# Patient Record
Sex: Female | Born: 1970 | Race: White | Hispanic: No | Marital: Single | Smoking: Current every day smoker
Health system: Southern US, Community
[De-identification: ages and names within clinical notes are randomized; demographics above are authoritative.]

## PROBLEM LIST (undated history)

## (undated) DIAGNOSIS — F32A Depression, unspecified: Secondary | ICD-10-CM

## (undated) DIAGNOSIS — F419 Anxiety disorder, unspecified: Secondary | ICD-10-CM

## (undated) DIAGNOSIS — T7840XA Allergy, unspecified, initial encounter: Secondary | ICD-10-CM

## (undated) HISTORY — PX: CHOLECYSTECTOMY: SHX55

## (undated) HISTORY — PX: OTHER SURGICAL HISTORY: SHX169

## (undated) HISTORY — PX: CYSTECTOMY: SUR359

## (undated) HISTORY — DX: Allergy, unspecified, initial encounter: T78.40XA

## (undated) HISTORY — DX: Anxiety disorder, unspecified: F41.9

---

## 2000-04-25 ENCOUNTER — Other Ambulatory Visit: Admission: RE | Admit: 2000-04-25 | Discharge: 2000-04-25 | Payer: Self-pay | Admitting: Family Medicine

## 2001-04-26 ENCOUNTER — Other Ambulatory Visit: Admission: RE | Admit: 2001-04-26 | Discharge: 2001-04-26 | Payer: Self-pay | Admitting: Family Medicine

## 2002-05-07 ENCOUNTER — Other Ambulatory Visit: Admission: RE | Admit: 2002-05-07 | Discharge: 2002-05-07 | Payer: Self-pay | Admitting: Family Medicine

## 2003-05-22 ENCOUNTER — Other Ambulatory Visit: Admission: RE | Admit: 2003-05-22 | Discharge: 2003-05-22 | Payer: Self-pay | Admitting: Family Medicine

## 2004-05-25 ENCOUNTER — Other Ambulatory Visit: Admission: RE | Admit: 2004-05-25 | Discharge: 2004-05-25 | Payer: Self-pay | Admitting: Family Medicine

## 2004-11-17 ENCOUNTER — Encounter: Admission: RE | Admit: 2004-11-17 | Discharge: 2004-11-17 | Payer: Self-pay | Admitting: Family Medicine

## 2005-06-29 ENCOUNTER — Other Ambulatory Visit: Admission: RE | Admit: 2005-06-29 | Discharge: 2005-06-29 | Payer: Self-pay | Admitting: Family Medicine

## 2006-06-21 ENCOUNTER — Other Ambulatory Visit: Admission: RE | Admit: 2006-06-21 | Discharge: 2006-06-21 | Payer: Self-pay | Admitting: Family Medicine

## 2008-03-07 ENCOUNTER — Ambulatory Visit: Payer: Self-pay | Admitting: Gastroenterology

## 2008-03-25 ENCOUNTER — Ambulatory Visit (HOSPITAL_COMMUNITY): Admission: RE | Admit: 2008-03-25 | Discharge: 2008-03-25 | Payer: Self-pay | Admitting: Gastroenterology

## 2008-03-25 ENCOUNTER — Ambulatory Visit: Payer: Self-pay | Admitting: Gastroenterology

## 2008-03-25 ENCOUNTER — Encounter: Payer: Self-pay | Admitting: Gastroenterology

## 2008-09-17 ENCOUNTER — Ambulatory Visit: Payer: Self-pay | Admitting: Gastroenterology

## 2008-09-26 ENCOUNTER — Encounter: Payer: Self-pay | Admitting: Gastroenterology

## 2008-09-26 ENCOUNTER — Ambulatory Visit: Payer: Self-pay | Admitting: Internal Medicine

## 2008-09-26 ENCOUNTER — Ambulatory Visit (HOSPITAL_COMMUNITY): Admission: RE | Admit: 2008-09-26 | Discharge: 2008-09-26 | Payer: Self-pay | Admitting: Gastroenterology

## 2008-09-30 ENCOUNTER — Telehealth (INDEPENDENT_AMBULATORY_CARE_PROVIDER_SITE_OTHER): Payer: Self-pay

## 2008-11-07 DIAGNOSIS — R197 Diarrhea, unspecified: Secondary | ICD-10-CM | POA: Insufficient documentation

## 2008-11-07 DIAGNOSIS — R1013 Epigastric pain: Secondary | ICD-10-CM | POA: Insufficient documentation

## 2008-11-07 DIAGNOSIS — Z8711 Personal history of peptic ulcer disease: Secondary | ICD-10-CM | POA: Insufficient documentation

## 2008-11-07 DIAGNOSIS — R109 Unspecified abdominal pain: Secondary | ICD-10-CM | POA: Insufficient documentation

## 2008-11-07 DIAGNOSIS — R110 Nausea: Secondary | ICD-10-CM | POA: Insufficient documentation

## 2008-11-07 DIAGNOSIS — K625 Hemorrhage of anus and rectum: Secondary | ICD-10-CM | POA: Insufficient documentation

## 2009-03-17 ENCOUNTER — Telehealth (INDEPENDENT_AMBULATORY_CARE_PROVIDER_SITE_OTHER): Payer: Self-pay

## 2009-03-21 ENCOUNTER — Encounter: Payer: Self-pay | Admitting: Gastroenterology

## 2009-04-21 ENCOUNTER — Encounter: Payer: Self-pay | Admitting: Gastroenterology

## 2009-05-23 ENCOUNTER — Encounter: Payer: Self-pay | Admitting: Urgent Care

## 2009-05-26 ENCOUNTER — Encounter: Payer: Self-pay | Admitting: Gastroenterology

## 2009-05-26 ENCOUNTER — Encounter (INDEPENDENT_AMBULATORY_CARE_PROVIDER_SITE_OTHER): Payer: Self-pay | Admitting: *Deleted

## 2010-12-22 NOTE — Consult Note (Signed)
Tammie [name], [name]               ACCOUNT NO.:  [id number]   MEDICAL RECORD NO.:  [id number]          PATIENT TYPE:  AMB   LOCATION:  DAY                           FACILITY:  [facility]   PHYSICIAN:  Kassie Mends, M.D.      DATE OF BIRTH:  10/21/1970   DATE OF CONSULTATION:  03/07/2008  DATE OF DISCHARGE:                                 CONSULTATION   REFERRING PHYSICIAN:  Kirstie Peri, MD   REASON FOR CONSULTATION:  Diarrhea and abdominal pain.   HISTORY OF PRESENT ILLNESS:  Tammie [name] is a 40 year old female who  presented in April 2009 with pain and nausea in her upper abdomen after  eating.  She had laparoscopic cholecystectomy because ultrasound showed  gallstones.  She is now no longer having problem with the diarrhea, the  pain, or the nausea.  Her bowel movements vary.  That could be normal to  watery.  She had had watery stool once or twice in the last month.  She  did note 3-4 weeks ago a quarter size amount of blood on her tissue when  she wiped after straining to have a bowel movement.  She does not take  any over-the-counter supplements.  She eats cheese once a day.  She does  not consume milk.  She does consume ice cream regularly.  She denies any  black stool or problem swallowing.  She occasionally have heartburn and  indigestion, which she treats with Zantac.  Her symptoms are usually  triggered by a [place] or a Spaghetti.   PAST MEDICAL HISTORY:  Positive H. pylori serology in April 2009,  treated with Pylera .   PAST SURGICAL HISTORY:  Cyst removed from her tail bone.   ALLERGIES:  ASPIRIN causes hives.   MEDICATIONS:  1. Depo-Provera.  2. Benadryl nightly for congestion.  3. Vitamin C.  4. Vitamin E.  5. Calcium with vitamin D.  6. Multivitamin.  7. Ibuprofen as needed.   FAMILY HISTORY:  She has no family history of celiac sprue, colon  cancer, or colon polyps.  She had 2 uncles with bone cancer and skin  cancer.  Her grandfather had lung and esophageal  cancer.   SOCIAL HISTORY:  She is not married.  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  She smokes half a pack a day.  She does not drink any alcohol.   PHYSICAL EXAMINATION:  VITAL SIGNS:  Weight 232 pounds, height 5 feet 3  inches, temperature 98.2, blood pressure 112/86, and pulse 68.  GENERAL:  She is in no apparent distress, alert, and oriented x4.HEENT:  Atraumatic and normocephalic.  Pupils equal and reactive to light.  Mouth, no oral lesions.  Posterior pharynx without erythema or  exudate.LUNGS:  Clear to auscultation bilaterally.CARDIOVASCULAR:  Regular rate and rhythm.  No murmur.  Normal S1 and S2. ABDOMEN:  Bowel  sounds are present.  Soft, nontender, and nondistended.  No rebound or  guarding. EXTREMITIES:  No cyanosis or edema. NEUROLOGIC:  No focal  neurologic deficits.   ASSESSMENT:  Tammie [name] is a 40 year old female  who had postprandial  abdominal pain and diarrhea, which improved with a laparoscopic  cholecystectomy.  Her rectal bleeding may be due to internal  hemorrhoids, but the differential diagnoses includes colorectal polyp,  arteriovenous malformation, or colorectal malignancy. Thank you for  allowing me to see Tammie [name] in consultation. My recommendations  follow.   RECOMMENDATIONS:  1. Will schedule for a colonoscopy with a MoviPrep to evaluate her      rectal bleeding.  2. She may follow up with me as needed.      Kassie Mends, M.D.  Electronically Signed     SM/MEDQ  D:  03/07/2008  T:  03/08/2008  Job:  [id number]   cc:   Kirstie Peri, MD  Fax: [phone]

## 2010-12-22 NOTE — Op Note (Signed)
NAMEJANEA, [name]               ACCOUNT NO.:  [id number]   MEDICAL RECORD NO.:  [id number]          PATIENT TYPE:  AMB   LOCATION:  DAY                           FACILITY:  [facility]   PHYSICIAN:  Kassie Mends, M.D.      DATE OF BIRTH:  10-26-1970   DATE OF PROCEDURE:  03/25/2008  DATE OF DISCHARGE:                               OPERATIVE REPORT   REFERRING PHYSICIAN:  Dr. Mylinda Latina   PROCEDURE:  Ileocolonoscopy with cold forceps biopsy.   INDICATION FOR EXAM:  Tammie [name] is a 40 year old female who presents  with postprandial diarrhea and intermittent rectal bleeding.  She has a  significant past medical history of laparoscopic cholecystectomy.   FINDINGS:  1. Normal terminal ileum, approximately 20 cm visualized.  2. Normal colon without evidence of polyps, masses, inflammatory      changes, diverticular AVMs.  3. Small internal hemorrhoids.  Otherwise, normal retroflexed view of      the rectum.   DIAGNOSES:  1. Source of intermittent rectal bleeding are likely internal      hemorrhoids.  2. No source for diarrhea identified.  Random biopsies obtained via      cold forceps to evaluate for microscopic colitis.   RECOMMENDATIONS:  1. Lactose-free high-fiber diet.  She is given a handout on high-fiber      diet and lactose-free diet and hemorrhoids.  2. No aspirin, NSAIDs, or anticoagulation for 5 days.  3. Screening colonoscopy in 13 years.  4. Will call Tammie [name] with results of her biopsies.  5. She has a followup appointment in 6 weeks with Dr. Cira Servant for      diarrhea.   MEDICATIONS:  1. Demerol 125 mg IV.  2. Versed 6 mg IV.   PROCEDURE TECHNIQUE:  Physical exam was performed.  Informed consent was  obtained from the patient.  I have explained the benefits, risks, and  alternatives to the procedure.  The patient was connected to monitor and  placed in the left lateral position.  Continuous oxygen was provided by  nasal cannula.  IV medicine administered through an  indwelling cannula.  After administration of sedation and rectal exam, the patient's rectum  was intubated.  The scope was advanced under direct visualization to the  distal terminal ileum.  The scope was removed slowly by  carefully examining the color, texture, anatomy, and integrity mucosa on  the way out.  The patient was recovered in endoscopy and discharged home  in satisfactory condition.   PATH:  NL colon      Kassie Mends, M.D.  Electronically Signed     SM/MEDQ  D:  03/25/2008  T:  03/25/2008  Job:  [id number]   cc:   Kirstie Peri, MD  Fax: [phone]

## 2010-12-22 NOTE — Op Note (Signed)
Tammie [name], Tammie [name]               ACCOUNT NO.:  [id number]   MEDICAL RECORD NO.:  [id number]          PATIENT TYPE:  AMB   LOCATION:  DAY                           FACILITY:  [facility]   PHYSICIAN:  Kassie Mends, M.D.      DATE OF BIRTH:  12/16/1970   DATE OF PROCEDURE:  09/26/2008  DATE OF DISCHARGE:                                PROCEDURE NOTE   REFERRING PHYSICIAN:  Kirstie Peri, MD   PROCEDURE:  Esophagogastroduodenoscopy with cold forceps biopsy of the  gastric mucosa.   INDICATION FOR EXAM:  Tammie [name] is a 40 year old female with  postprandial nausea and abdominal pain.  She is taking multiple doses of  ibuprofen.  She may take up to 6 a day.  She is on omeprazole.  She has  history of positive H. pylori serology, which was treated.   FINDINGS:  1. Normal esophagus without evidence of Barrett, mass, erosion,      ulceration, or stricture.  2. Patchy erythema in the antrum and the body of the stomach without      erosion or ulceration.  Biopsies obtained via cold forceps to      evaluate for H. pylori gastritis.  3. Normal duodenal bulb and second portion of the duodenum.   DIAGNOSES:  1. Mild gastritis.  2. Postprandial abdominal pain and nausea associated with an      alternating stool consistency that is most likely secondary to      irritable bowel syndrome.   RECOMMENDATIONS:  1. Will call with biopsies.  2. No aspirin or anti-inflammatory drugs for 14 days.  3. She has a schedule followup in 2 months with Tana Coast regarding      her abdominal pain.  4. Add Bentyl 10 mg p.o. 30 minutes prior to breakfast and supper.  5. She should continue a high-fiber lactose-free diet.  She was given      a handout on gastritis.   MEDICATIONS:  1. Demerol 100 mg IV.  2. Versed 6 mg IV.   PROCEDURE TECHNIQUE:  Physical exam was performed.  Informed consent was  obtained from the patient after explaining the benefits, risks, and  alternatives to the procedure.  The patient  was connected to the monitor  and placed in the left lateral position.  Continuous oxygen was provided  by nasal cannula and IV medicine administered through an indwelling  cannula.  After administration of sedation, the patient's esophagus was  intubated and the scope was advanced under direct visualization to the  second portion of the duodenum.  The scope was removed  slowly by carefully examining the color, texture, anatomy, and integrity  of the mucosa on the way out.  The patient was recovered in endoscopy  and discharged home in satisfactory condition.   PATH:  Reactrive gastropathy      Kassie Mends, M.D.  Electronically Signed     SM/MEDQ  D:  09/26/2008  T:  09/26/2008  Job:  [id number]   cc:   Kirstie Peri, MD  Fax: [phone]

## 2010-12-22 NOTE — H&P (Signed)
Tammie [name], Tammie [name]               ACCOUNT NO.:  [id number]   MEDICAL RECORD NO.:  [id number]          PATIENT TYPE:  AMB   LOCATION:  DAY                           FACILITY:  [facility]   PHYSICIAN:  Kassie Mends, M.D.      DATE OF BIRTH:  22-Apr-1971   DATE OF ADMISSION:  DATE OF DISCHARGE:  LH                              HISTORY & PHYSICAL   CHIEF COMPLAINT:  Abdominal pain, nausea.   HISTORY OF PRESENT ILLNESS:  The patient is a 40 year old lady who  presents today with a couple of months' history of epigastric abdominal  pain associated with nausea.  She saw Dr. Cira Servant back in July 2009, for  her complaints of diarrhea and abdominal pain.  She had her gallbladder  removed in April 2009, and at that time she was seen here few months  later.  Her upper abdominal pain postprandially had pretty much  resolved.  She underwent a colonoscopy and was found to have small  internal hemorrhoids and normal terminal ileum up to 20 cm.  So, she was  asked to consume a lactose-free high-fiber diet.  She had random  biopsies taken to rule out microscopic colitis and they were benign.   The patient states that over the past couple of months she has had  recurrent postprandial epigastric pain  like what she had prior to  getting her gallbladder removed.  She recalls having one large gallstone  at the time of her surgery.  She admittedly has abdominal pain without  meals as well.  She describes the pain as knife-like and begins in the  epigastrium and goes down into the umbilicus.  Pain was severe yesterday  and she ended up going to the emergency department.  She had labs with  normal lipase and LFTs.  BUN and creatinine were normal.  CBC normal.  UA suggestive of a UTI and she was started on Ceftin.  She saw Dr. Clelia Croft  about a month ago and was put on Prevacid.  Yesterday, she was switched  to omeprazole by the emergency department doctor.  She denies any  dysphagia or odynophagia.  She denies any  heartburn.  Her bowel  movements go from loose to more firm alternating.  She rarely sees any  rectal bleeding anymore.  She admittedly takes ibuprofen about 8 a day.   CURRENT MEDICATIONS:  1. Depo-Provera once every 3 months.  2. Benadryl at bedtime.  3. Vitamin C daily.  4. Vitamin E daily.  5. Calcium with vitamin D daily.  6. Multivitamin daily.  7. Ibuprofen 8 daily.  8. Ceftin 250 mg b.i.d.  9. Omeprazole once daily, but she is not sure of the strength.   ALLERGIES:  ASPIRIN causes hives and CODEINE causes nausea.   PAST MEDICAL HISTORY:  History of positive H. pylori serology April  2009, treated with Pylera, colonoscopy as outlined above.   PAST SURGICAL HISTORY:  Status post cholecystectomy April 2009.  She had  a cyst removed from her tail bone.   FAMILY HISTORY:  Her father had liver transplant after gastric bypass  years ago, paternal grandmother had bleeding ulcers due to aspirin, 2  uncles deceased due to bone cancer and melanoma, and grandfather had  lung and esophageal cancer.  No family history of colorectal cancer or  colon polyps to her knowledge.   SOCIAL HISTORY:  She is single.  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  She smokes half a pack of  cigarettes daily, does not consume alcohol.   REVIEW OF SYSTEMS:  See HPI for GI.  She has had a 20-pound weight loss,  is currently on Weight Watchers.  Cardiopulmonary:  No chest pain,  shortness of breath, palpitations, or cough.  Genitourinary:  No dysuria  or hematuria.   PHYSICAL EXAMINATION:  VITAL SIGNS:  Weight 213, height 5 feet 4 inches,  temp 98.5, blood pressure 120/94, and pulse 74.  GENERAL:  A pleasant well-nourished, well-developed obese Caucasian  female in no acute distress.  SKIN:  Warm and dry.  No jaundice.  HEENT:  Sclerae nonicteric.  Oropharyngeal mucosa moist and pink.  No  lesions, erythema, or exudate.  No lymphadenopathy of thyromegaly.  CHEST:  Lungs  are clear to auscultation.  CARDIAC:  Regular rate and rhythm.  Normal S1 and S2.  No murmurs, rubs,  or gallops.  ABDOMEN:  Positive bowel sounds.  Abdomen is soft and obese.  She has  moderate tenderness in the epigastrium to deep palpation.  No rebound or  guarding.  No organomegaly or masses.  No abdominal bruits or hernias.  LOWER EXTREMITIES:  No edema.   IMPRESSION:  Tammie [name] is a 40 year old lady with a couple of months'  history of recurrent epigastric pain.  Symptoms are postprandially, but  also without meals.  She has nausea, but no vomiting.  Denies any reflux  symptoms.  She takes a significant amount of ibuprofen daily, would be  concerned about possibility of peptic ulcer disease or gastritis.  History of Helicobacter pylori status post treatment in April 2009.   PLAN:  1. EGD with Dr. Cira Servant in the near future.  2. We would like for her to be taking omeprazole at least 40 mg daily      30 minutes before breakfast.  She will check her dose at home and      adjust appropriately.  3. I advised no ibuprofen use until procedure and she does need to      consider limiting her use at any rate.      Tana Coast, P.A.      Kassie Mends, M.D.  Electronically Signed   LL/MEDQ  D:  09/17/2008  T:  09/18/2008  Job:  [id number]   cc:   Dr. Clelia Croft

## 2015-05-01 ENCOUNTER — Ambulatory Visit (INDEPENDENT_AMBULATORY_CARE_PROVIDER_SITE_OTHER): Payer: 59 | Admitting: Otolaryngology

## 2015-05-01 DIAGNOSIS — K219 Gastro-esophageal reflux disease without esophagitis: Secondary | ICD-10-CM | POA: Diagnosis not present

## 2015-05-01 DIAGNOSIS — R070 Pain in throat: Secondary | ICD-10-CM

## 2015-07-17 ENCOUNTER — Ambulatory Visit (INDEPENDENT_AMBULATORY_CARE_PROVIDER_SITE_OTHER): Payer: 59 | Admitting: Otolaryngology

## 2015-07-17 DIAGNOSIS — K219 Gastro-esophageal reflux disease without esophagitis: Secondary | ICD-10-CM

## 2015-07-17 DIAGNOSIS — R490 Dysphonia: Secondary | ICD-10-CM | POA: Diagnosis not present

## 2016-05-17 DIAGNOSIS — Z713 Dietary counseling and surveillance: Secondary | ICD-10-CM | POA: Diagnosis not present

## 2016-05-17 DIAGNOSIS — Z299 Encounter for prophylactic measures, unspecified: Secondary | ICD-10-CM | POA: Diagnosis not present

## 2016-05-17 DIAGNOSIS — Z6841 Body Mass Index (BMI) 40.0 and over, adult: Secondary | ICD-10-CM | POA: Diagnosis not present

## 2016-05-17 DIAGNOSIS — M549 Dorsalgia, unspecified: Secondary | ICD-10-CM | POA: Diagnosis not present

## 2016-05-20 DIAGNOSIS — Z1231 Encounter for screening mammogram for malignant neoplasm of breast: Secondary | ICD-10-CM | POA: Diagnosis not present

## 2016-05-26 DIAGNOSIS — Z299 Encounter for prophylactic measures, unspecified: Secondary | ICD-10-CM | POA: Diagnosis not present

## 2016-05-26 DIAGNOSIS — Z713 Dietary counseling and surveillance: Secondary | ICD-10-CM | POA: Diagnosis not present

## 2016-05-26 DIAGNOSIS — Z6841 Body Mass Index (BMI) 40.0 and over, adult: Secondary | ICD-10-CM | POA: Diagnosis not present

## 2016-05-26 DIAGNOSIS — M549 Dorsalgia, unspecified: Secondary | ICD-10-CM | POA: Diagnosis not present

## 2016-06-02 DIAGNOSIS — Z299 Encounter for prophylactic measures, unspecified: Secondary | ICD-10-CM | POA: Diagnosis not present

## 2016-06-02 DIAGNOSIS — M549 Dorsalgia, unspecified: Secondary | ICD-10-CM | POA: Diagnosis not present

## 2016-06-02 DIAGNOSIS — M5432 Sciatica, left side: Secondary | ICD-10-CM | POA: Diagnosis not present

## 2016-06-02 DIAGNOSIS — Z6841 Body Mass Index (BMI) 40.0 and over, adult: Secondary | ICD-10-CM | POA: Diagnosis not present

## 2016-06-07 DIAGNOSIS — M545 Low back pain: Secondary | ICD-10-CM | POA: Diagnosis not present

## 2016-06-11 DIAGNOSIS — M549 Dorsalgia, unspecified: Secondary | ICD-10-CM | POA: Diagnosis not present

## 2016-06-11 DIAGNOSIS — Z299 Encounter for prophylactic measures, unspecified: Secondary | ICD-10-CM | POA: Diagnosis not present

## 2016-07-19 DIAGNOSIS — M1710 Unilateral primary osteoarthritis, unspecified knee: Secondary | ICD-10-CM | POA: Diagnosis not present

## 2016-07-19 DIAGNOSIS — Z299 Encounter for prophylactic measures, unspecified: Secondary | ICD-10-CM | POA: Diagnosis not present

## 2016-07-19 DIAGNOSIS — Z6841 Body Mass Index (BMI) 40.0 and over, adult: Secondary | ICD-10-CM | POA: Diagnosis not present

## 2016-09-13 DIAGNOSIS — Z299 Encounter for prophylactic measures, unspecified: Secondary | ICD-10-CM | POA: Diagnosis not present

## 2016-09-13 DIAGNOSIS — F17210 Nicotine dependence, cigarettes, uncomplicated: Secondary | ICD-10-CM | POA: Diagnosis not present

## 2016-09-13 DIAGNOSIS — K219 Gastro-esophageal reflux disease without esophagitis: Secondary | ICD-10-CM | POA: Diagnosis not present

## 2016-09-13 DIAGNOSIS — I10 Essential (primary) hypertension: Secondary | ICD-10-CM | POA: Diagnosis not present

## 2016-09-13 DIAGNOSIS — Z6841 Body Mass Index (BMI) 40.0 and over, adult: Secondary | ICD-10-CM | POA: Diagnosis not present

## 2016-09-13 DIAGNOSIS — Z713 Dietary counseling and surveillance: Secondary | ICD-10-CM | POA: Diagnosis not present

## 2016-09-13 DIAGNOSIS — M549 Dorsalgia, unspecified: Secondary | ICD-10-CM | POA: Diagnosis not present

## 2016-10-07 DIAGNOSIS — F17210 Nicotine dependence, cigarettes, uncomplicated: Secondary | ICD-10-CM | POA: Diagnosis not present

## 2016-10-07 DIAGNOSIS — I10 Essential (primary) hypertension: Secondary | ICD-10-CM | POA: Diagnosis not present

## 2016-10-07 DIAGNOSIS — M25562 Pain in left knee: Secondary | ICD-10-CM | POA: Diagnosis not present

## 2016-10-07 DIAGNOSIS — Z6841 Body Mass Index (BMI) 40.0 and over, adult: Secondary | ICD-10-CM | POA: Diagnosis not present

## 2016-10-07 DIAGNOSIS — Z713 Dietary counseling and surveillance: Secondary | ICD-10-CM | POA: Diagnosis not present

## 2016-10-07 DIAGNOSIS — Z299 Encounter for prophylactic measures, unspecified: Secondary | ICD-10-CM | POA: Diagnosis not present

## 2016-11-12 DIAGNOSIS — J069 Acute upper respiratory infection, unspecified: Secondary | ICD-10-CM | POA: Diagnosis not present

## 2016-11-12 DIAGNOSIS — Z299 Encounter for prophylactic measures, unspecified: Secondary | ICD-10-CM | POA: Diagnosis not present

## 2016-11-12 DIAGNOSIS — K219 Gastro-esophageal reflux disease without esophagitis: Secondary | ICD-10-CM | POA: Diagnosis not present

## 2016-11-12 DIAGNOSIS — Z6841 Body Mass Index (BMI) 40.0 and over, adult: Secondary | ICD-10-CM | POA: Diagnosis not present

## 2016-11-12 DIAGNOSIS — I10 Essential (primary) hypertension: Secondary | ICD-10-CM | POA: Diagnosis not present

## 2016-12-31 DIAGNOSIS — Z6841 Body Mass Index (BMI) 40.0 and over, adult: Secondary | ICD-10-CM | POA: Diagnosis not present

## 2016-12-31 DIAGNOSIS — J069 Acute upper respiratory infection, unspecified: Secondary | ICD-10-CM | POA: Diagnosis not present

## 2016-12-31 DIAGNOSIS — Z713 Dietary counseling and surveillance: Secondary | ICD-10-CM | POA: Diagnosis not present

## 2016-12-31 DIAGNOSIS — Z299 Encounter for prophylactic measures, unspecified: Secondary | ICD-10-CM | POA: Diagnosis not present

## 2017-01-13 DIAGNOSIS — I499 Cardiac arrhythmia, unspecified: Secondary | ICD-10-CM | POA: Diagnosis not present

## 2017-01-13 DIAGNOSIS — Z1389 Encounter for screening for other disorder: Secondary | ICD-10-CM | POA: Diagnosis not present

## 2017-01-13 DIAGNOSIS — K219 Gastro-esophageal reflux disease without esophagitis: Secondary | ICD-10-CM | POA: Diagnosis not present

## 2017-01-13 DIAGNOSIS — Z299 Encounter for prophylactic measures, unspecified: Secondary | ICD-10-CM | POA: Diagnosis not present

## 2017-01-13 DIAGNOSIS — F17210 Nicotine dependence, cigarettes, uncomplicated: Secondary | ICD-10-CM | POA: Diagnosis not present

## 2017-01-13 DIAGNOSIS — Z79899 Other long term (current) drug therapy: Secondary | ICD-10-CM | POA: Diagnosis not present

## 2017-01-13 DIAGNOSIS — Z6841 Body Mass Index (BMI) 40.0 and over, adult: Secondary | ICD-10-CM | POA: Diagnosis not present

## 2017-01-13 DIAGNOSIS — G5600 Carpal tunnel syndrome, unspecified upper limb: Secondary | ICD-10-CM | POA: Diagnosis not present

## 2017-01-13 DIAGNOSIS — M1710 Unilateral primary osteoarthritis, unspecified knee: Secondary | ICD-10-CM | POA: Diagnosis not present

## 2017-01-13 DIAGNOSIS — Z01419 Encounter for gynecological examination (general) (routine) without abnormal findings: Secondary | ICD-10-CM | POA: Diagnosis not present

## 2017-01-13 DIAGNOSIS — I10 Essential (primary) hypertension: Secondary | ICD-10-CM | POA: Diagnosis not present

## 2017-01-13 DIAGNOSIS — F419 Anxiety disorder, unspecified: Secondary | ICD-10-CM | POA: Diagnosis not present

## 2017-01-13 DIAGNOSIS — Z0000 Encounter for general adult medical examination without abnormal findings: Secondary | ICD-10-CM | POA: Diagnosis not present

## 2017-01-19 DIAGNOSIS — Z79899 Other long term (current) drug therapy: Secondary | ICD-10-CM | POA: Diagnosis not present

## 2017-01-19 DIAGNOSIS — I10 Essential (primary) hypertension: Secondary | ICD-10-CM | POA: Diagnosis not present

## 2017-01-19 DIAGNOSIS — Z0000 Encounter for general adult medical examination without abnormal findings: Secondary | ICD-10-CM | POA: Diagnosis not present

## 2017-01-19 DIAGNOSIS — F419 Anxiety disorder, unspecified: Secondary | ICD-10-CM | POA: Diagnosis not present

## 2017-03-17 DIAGNOSIS — Z79899 Other long term (current) drug therapy: Secondary | ICD-10-CM | POA: Diagnosis not present

## 2017-03-17 DIAGNOSIS — M545 Low back pain: Secondary | ICD-10-CM | POA: Diagnosis not present

## 2017-03-17 DIAGNOSIS — M533 Sacrococcygeal disorders, not elsewhere classified: Secondary | ICD-10-CM | POA: Diagnosis not present

## 2017-03-17 DIAGNOSIS — M461 Sacroiliitis, not elsewhere classified: Secondary | ICD-10-CM | POA: Diagnosis not present

## 2017-03-17 DIAGNOSIS — M79604 Pain in right leg: Secondary | ICD-10-CM | POA: Diagnosis not present

## 2017-04-01 DIAGNOSIS — J069 Acute upper respiratory infection, unspecified: Secondary | ICD-10-CM | POA: Diagnosis not present

## 2017-04-01 DIAGNOSIS — Z299 Encounter for prophylactic measures, unspecified: Secondary | ICD-10-CM | POA: Diagnosis not present

## 2017-04-01 DIAGNOSIS — I82611 Acute embolism and thrombosis of superficial veins of right upper extremity: Secondary | ICD-10-CM | POA: Diagnosis not present

## 2017-04-01 DIAGNOSIS — K219 Gastro-esophageal reflux disease without esophagitis: Secondary | ICD-10-CM | POA: Diagnosis not present

## 2017-04-01 DIAGNOSIS — I10 Essential (primary) hypertension: Secondary | ICD-10-CM | POA: Diagnosis not present

## 2017-04-01 DIAGNOSIS — N959 Unspecified menopausal and perimenopausal disorder: Secondary | ICD-10-CM | POA: Diagnosis not present

## 2017-04-01 DIAGNOSIS — Z6841 Body Mass Index (BMI) 40.0 and over, adult: Secondary | ICD-10-CM | POA: Diagnosis not present

## 2017-04-01 DIAGNOSIS — F17210 Nicotine dependence, cigarettes, uncomplicated: Secondary | ICD-10-CM | POA: Diagnosis not present

## 2017-07-01 DIAGNOSIS — Z6841 Body Mass Index (BMI) 40.0 and over, adult: Secondary | ICD-10-CM | POA: Diagnosis not present

## 2017-07-01 DIAGNOSIS — Z299 Encounter for prophylactic measures, unspecified: Secondary | ICD-10-CM | POA: Diagnosis not present

## 2017-07-01 DIAGNOSIS — I10 Essential (primary) hypertension: Secondary | ICD-10-CM | POA: Diagnosis not present

## 2017-07-01 DIAGNOSIS — Z713 Dietary counseling and surveillance: Secondary | ICD-10-CM | POA: Diagnosis not present

## 2017-07-01 DIAGNOSIS — M7711 Lateral epicondylitis, right elbow: Secondary | ICD-10-CM | POA: Diagnosis not present

## 2017-07-01 DIAGNOSIS — R350 Frequency of micturition: Secondary | ICD-10-CM | POA: Diagnosis not present

## 2017-07-01 DIAGNOSIS — I82611 Acute embolism and thrombosis of superficial veins of right upper extremity: Secondary | ICD-10-CM | POA: Diagnosis not present

## 2017-07-01 DIAGNOSIS — K219 Gastro-esophageal reflux disease without esophagitis: Secondary | ICD-10-CM | POA: Diagnosis not present

## 2017-07-01 DIAGNOSIS — N390 Urinary tract infection, site not specified: Secondary | ICD-10-CM | POA: Diagnosis not present

## 2017-09-07 DIAGNOSIS — M810 Age-related osteoporosis without current pathological fracture: Secondary | ICD-10-CM | POA: Diagnosis not present

## 2017-09-07 DIAGNOSIS — L509 Urticaria, unspecified: Secondary | ICD-10-CM | POA: Diagnosis not present

## 2017-09-07 DIAGNOSIS — F17200 Nicotine dependence, unspecified, uncomplicated: Secondary | ICD-10-CM | POA: Diagnosis not present

## 2017-09-07 DIAGNOSIS — K219 Gastro-esophageal reflux disease without esophagitis: Secondary | ICD-10-CM | POA: Diagnosis not present

## 2017-09-07 DIAGNOSIS — M1990 Unspecified osteoarthritis, unspecified site: Secondary | ICD-10-CM | POA: Diagnosis not present

## 2017-09-07 DIAGNOSIS — Z79899 Other long term (current) drug therapy: Secondary | ICD-10-CM | POA: Diagnosis not present

## 2017-09-28 DIAGNOSIS — Z6838 Body mass index (BMI) 38.0-38.9, adult: Secondary | ICD-10-CM | POA: Diagnosis not present

## 2017-09-28 DIAGNOSIS — L509 Urticaria, unspecified: Secondary | ICD-10-CM | POA: Diagnosis not present

## 2017-10-06 ENCOUNTER — Ambulatory Visit (INDEPENDENT_AMBULATORY_CARE_PROVIDER_SITE_OTHER): Payer: BLUE CROSS/BLUE SHIELD | Admitting: Allergy & Immunology

## 2017-10-06 ENCOUNTER — Encounter: Payer: Self-pay | Admitting: Allergy & Immunology

## 2017-10-06 VITALS — BP 128/80 | HR 93 | Temp 98.3°F | Resp 18 | Ht 62.5 in | Wt 221.6 lb

## 2017-10-06 DIAGNOSIS — L508 Other urticaria: Secondary | ICD-10-CM | POA: Diagnosis not present

## 2017-10-06 DIAGNOSIS — J302 Other seasonal allergic rhinitis: Secondary | ICD-10-CM

## 2017-10-06 MED ORDER — FEXOFENADINE HCL 180 MG PO TABS
360.0000 mg | ORAL_TABLET | Freq: Every day | ORAL | 5 refills | Status: DC
Start: 2017-10-06 — End: 2018-09-29

## 2017-10-06 MED ORDER — EPINEPHRINE 0.3 MG/0.3ML IJ SOAJ: 0.3000 mg | Freq: Once | INTRAMUSCULAR | 1 refills | Status: AC

## 2017-10-06 MED ORDER — CETIRIZINE HCL 10 MG PO TABS: 20.0000 mg | ORAL_TABLET | Freq: Every day | ORAL | 5 refills | Status: DC

## 2017-10-06 MED ORDER — RANITIDINE HCL 300 MG PO TABS: 300.0000 mg | ORAL_TABLET | Freq: Every day | ORAL | 5 refills | Status: DC

## 2017-10-06 NOTE — Progress Notes (Signed)
NEW PATIENT  Date of Service/Encounter:  10/06/17  Referring provider: Rosalee Kaufman, PA-C   Assessment:   Chronic idiopathic urticaria  Seasonal allergic rhinitis  Plan/Recommendations:   1. Chronic urticaria - Your history does not have any "red flags" such as fevers, joint pains, or permanent skin changes that would be concerning for a more serious cause of hives.  - We will get some labs to rule out serious causes of hives: complete blood count, tryptase level, chronic urticaria panel, CMP, ESR, and CRP. - We will also get an environmental allergy panel as well, given the history of allergic rhinitis symptoms.  - Chronic hives are often times a self limited process and will "burn themselves out" over 6-12 months, although this is not always the case.   - We will send in [place] (epinephrine); they should call you in 1-2 days to confirm your shipping address.  - In the meantime, start suppressive dosing of antihistamines:   - Morning: Allegra (fexofenadine) 350m (two tablets)  - Evening: Zyrtec (cetirizine) 216m(two tablets) + Singulair (montelukast) 1060m- If the above is not working, try adding: Zantac (ranitidine) 300m44mwo tablets) - You can change this dosing at home, decreasing the dose as needed or increasing the dosing as needed.  - If you are not tolerating the medications or are tired of taking them every day, we can start treatment with a monthly injectable medication called Xolair.   2. Return in about 3 months (around 01/03/2018).  Subjective:   Tammie [name] 46 y44. female presenting today for evaluation of  Chief Complaint  Patient presents with  . Urticaria    Tammie [name] has a history of the following: Patient Active Problem List   Diagnosis Date Noted  . RECTAL BLEEDING 11/07/2008  . NAUSEA 11/07/2008  . DIARRHEA 11/07/2008  . ABDOMINAL PAIN 11/07/2008  . EPIGASTRIC PAIN 11/07/2008  . HELICOBACTER PYLORI INFECTION, HX OF  11/07/2008    History obtained from: chart review and patient and the patient's friend who accompanies her today.  Tammie [name] was referred by SkilRosalee [name]-C.     [name] 46 y61. female presenting for evaluation of urticaria. She has a long-standing history of hives. She first had hives since she was 7 ye17rs old. Her mother had given her acetaminophen prior to this episode. Evidently, per the patient, she was diagnosed with an allergy to artificial colors and aspirin. Then they went away for a period of time. Years later - in high school - she developed hives to Excedrin.   Between high school and now, she has been fine. Then around four weeks ago her hives started up again. She was told to take three Benadryl daily. Then she went somewhere else and was told to take Claritin daily. During these episodes, she has mostly had no problems with systemic reactions. Then today she was at work dealing with her boss and developed shortness of breath and felt as if her throat was closing. She thinks that this could have been related to stress from her boss. This seemed to improve with some exposure to the outdoor air. She has been under quite a bit of stress lately. She is also stressed at home where she is taking care of her mother. She is also looking after her fiancee's parent as well. 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   She has no history  of asthma at all. Otherwise, there is no history of other atopic diseases, including drug allergies, food allergies, environmental allergies, or stinging insect allergies. There is no significant infectious history. Vaccinations are up to date.    Past Medical History: Patient Active Problem List   Diagnosis Date Noted  . RECTAL BLEEDING 11/07/2008  . NAUSEA 11/07/2008  . DIARRHEA 11/07/2008  . ABDOMINAL PAIN 11/07/2008  . EPIGASTRIC PAIN 11/07/2008  . HELICOBACTER PYLORI INFECTION, HX OF  11/07/2008    Medication List:  Allergies as of 10/06/2017      Reactions   Aspirin    Codeine       Medication List        Accurate as of 10/06/17  8:45 PM. Always use your most recent med list.          cetirizine 10 MG tablet Commonly known as:  ZYRTEC Take 2 tablets (20 mg total) by mouth daily.   EPINEPHrine 0.3 mg/0.3 mL Soaj injection Commonly known as:  AUVI-Q Inject 0.3 mLs (0.3 mg total) into the muscle once for 1 dose.   fexofenadine 180 MG tablet Commonly known as:  ALLEGRA Take 2 tablets (360 mg total) by mouth daily.   meloxicam 15 MG tablet Commonly known as:  MOBIC Take 15 mg by mouth daily.   omeprazole 20 MG capsule Commonly known as:  PRILOSEC Take 20 mg by mouth daily.   ranitidine 300 MG tablet Commonly known as:  ZANTAC Take 1 tablet (300 mg total) by mouth at bedtime.       Birth History: non-contributory.   Developmental History: non-contributory.   Past Surgical History: Past Surgical History:  Procedure Laterality Date  . CHOLECYSTECTOMY       Family History: History reviewed. No pertinent family history.   Social History: Tanicka lives at home with her family. They live in a 48yo home. There is wood flooring throughout the home. There is gas and electric heating as well as central cooling. There is one dog in the home. There are no dust mite covers on her bedding. She does smoke in the home since 1990 (1/2 pack per day).      Review of Systems: a 14-point review of systems is pertinent for what is mentioned in HPI.  Otherwise, all other systems were negative. Constitutional: negative other than that listed in the HPI Eyes: negative other than that listed in the HPI Ears, nose, mouth, throat, and face: negative other than that listed in the HPI Respiratory: negative other than that listed in the HPI Cardiovascular: negative other than that listed in the HPI Gastrointestinal: negative other than that listed in the  HPI Genitourinary: negative other than that listed in the HPI Integument: negative other than that listed in the HPI Hematologic: negative other than that listed in the HPI Musculoskeletal: negative other than that listed in the HPI Neurological: negative other than that listed in the HPI Allergy/Immunologic: negative other than that listed in the HPI    Objective:   Blood pressure 128/80, pulse 93, temperature 98.3 F (36.8 C), resp. rate 18, height 5' 2.5" (1.588 m), weight 221 lb 9.6 oz (100.5 kg), SpO2 97 %. Body mass index is 39.89 kg/m.   Physical Exam:  General: Alert, interactive, in no acute distress. Clearly.distressed.Smells strongly of cigarette smoke.  Eyes: No conjunctival injection bilaterally, no discharge on the right, no discharge on the left and no Horner-Trantas dots present. PERRL bilaterally. EOMI without pain. No photophobia.  Ears: Right TM pearly  gray with normal light reflex, Left TM pearly gray with normal light reflex, Right TM intact without perforation and Left TM intact without perforation.  Nose/Throat: External nose within normal limits and septum midline. Turbinates edematous and pale with clear discharge. Posterior oropharynx erythematous without cobblestoning in the posterior oropharynx. Tonsils 2+ without exudates.  Tongue without thrush. Neck: Supple without thyromegaly. Trachea midline. Adenopathy: no enlarged lymph nodes appreciated in the anterior cervical, occipital, axillary, epitrochlear, inguinal, or popliteal regions. Lungs: Clear to auscultation without wheezing, rhonchi or rales. No increased work of breathing. CV: Normal S1/S2. No murmurs. Capillary refill <2 seconds.  Abdomen: Nondistended, nontender. No guarding or rebound tenderness. Bowel sounds present in all fields and hypoactive  Skin: Dry, mildly hyperpigmented, mildly thickened patches on the abdomen, bilateral arms, bilateral legs, and waist. Extremities:  No clubbing, cyanosis  or edema. Neuro:   Grossly intact. No focal deficits appreciated. Responsive to questions.  Diagnostic studies: deferred due to recent antihistamine use    Salvatore Marvel, MD [facility]

## 2017-10-06 NOTE — Patient Instructions (Addendum)
1. Chronic urticaria - Your history does not have any "red flags" such as fevers, joint pains, or permanent skin changes that would be concerning for a more serious cause of hives.  - We will get some labs to rule out serious causes of hives: complete blood count, tryptase level, chronic urticaria panel, CMP, ESR, and CRP. - Chronic hives are often times a self limited process and will "burn themselves out" over 6-12 months, although this is not always the case.   - We will send in [place] (epinephrine); they should call you in 1-2 days to confirm your shipping address.  - In the meantime, start suppressive dosing of antihistamines:   - Morning: Allegra (fexofenadine) 372m (two tablets)  - Evening: Zyrtec (cetirizine) 260m(two tablets) + Singulair (montelukast) 1033m- If the above is not working, try adding: Zantac (ranitidine) 300m35mwo tablets) - You can change this dosing at home, decreasing the dose as needed or increasing the dosing as needed.  - If you are not tolerating the medications or are tired of taking them every day, we can start treatment with a monthly injectable medication called Xolair.   2. Return in about 3 months (around 01/03/2018).   Please inform us o[place]any Emergency Department visits, hospitalizations, or changes in symptoms. Call us b[place]ore going to the ED for breathing or allergy symptoms since we might be able to fit you in for a sick visit. Feel free to contact us a[place]atime with any questions, problems, or concerns.  It was a pleasure to meet you today!  Websites that have reliable patient information: 1. American Academy of Asthma, Allergy, and Immunology: www.aaaai.org 2. Food Allergy Research and Education (FARE): foodallergy.org 3. Mothers of Asthmatics: http://www.asthmacommunitynetwork.org 4. American College of Allergy, Asthma, and Immunology: www.acaai.org

## 2017-10-07 ENCOUNTER — Other Ambulatory Visit: Payer: Self-pay | Admitting: Allergy & Immunology

## 2017-10-07 MED ORDER — MONTELUKAST SODIUM 10 MG PO TABS: 10.0000 mg | ORAL_TABLET | Freq: Every day | ORAL | 5 refills | Status: DC

## 2017-10-07 NOTE — Telephone Encounter (Signed)
Called and informed patient that I have sent that Rx over to the pharmacy.

## 2017-10-07 NOTE — Progress Notes (Signed)
Lab requisition printed. I will give to Meagan with [facility] to have it added.

## 2017-10-07 NOTE — Telephone Encounter (Signed)
Patient saw Dr. Dellis AnesGallagher yesterday, 10-06-17, and was suppose to get the prescription for Singulair. She went to her pharmacy and it was not there. She was able to pick up Zyrtec and Allegra. [facility].

## 2017-10-08 LAB — CBC WITH DIFFERENTIAL/PLATELET
Basophils Absolute: 0 10*3/uL (ref 0.0–0.2)
Basos: 0 %
EOS (ABSOLUTE): 0.1 10*3/uL (ref 0.0–0.4)
EOS: 1 %
HEMATOCRIT: 45.8 % (ref 34.0–46.6)
HEMOGLOBIN: 15.7 g/dL (ref 11.1–15.9)
Immature Grans (Abs): 0 10*3/uL (ref 0.0–0.1)
Immature Granulocytes: 0 %
LYMPHS ABS: 1.7 10*3/uL (ref 0.7–3.1)
Lymphs: 18 %
MCH: 33.6 pg — AB (ref 26.6–33.0)
MCHC: 34.3 g/dL (ref 31.5–35.7)
MCV: 98 fL — ABNORMAL HIGH (ref 79–97)
MONOCYTES: 8 %
MONOS ABS: 0.7 10*3/uL (ref 0.1–0.9)
NEUTROS ABS: 6.9 10*3/uL (ref 1.4–7.0)
Neutrophils: 73 %
Platelets: 232 10*3/uL (ref 150–379)
RBC: 4.67 x10E6/uL (ref 3.77–5.28)
RDW: 13 % (ref 12.3–15.4)
WBC: 9.5 10*3/uL (ref 3.4–10.8)

## 2017-10-08 LAB — CMP14+EGFR
A/G RATIO: 1.6 (ref 1.2–2.2)
ALBUMIN: 4.6 g/dL (ref 3.5–5.5)
ALK PHOS: 58 IU/L (ref 39–117)
ALT: 26 IU/L (ref 0–32)
AST: 23 IU/L (ref 0–40)
BILIRUBIN TOTAL: 0.9 mg/dL (ref 0.0–1.2)
BUN / CREAT RATIO: 17 (ref 9–23)
BUN: 12 mg/dL (ref 6–24)
CHLORIDE: 101 mmol/L (ref 96–106)
CO2: 20 mmol/L (ref 20–29)
Calcium: 9.6 mg/dL (ref 8.7–10.2)
Creatinine, Ser: 0.71 mg/dL (ref 0.57–1.00)
GFR calc Af Amer: 117 mL/min/{1.73_m2} (ref >59)
GFR calc non Af Amer: 102 mL/min/{1.73_m2} (ref >59)
GLOBULIN, TOTAL: 2.8 g/dL (ref 1.5–4.5)
Glucose: 88 mg/dL (ref 65–99)
POTASSIUM: 4.2 mmol/L (ref 3.5–5.2)
SODIUM: 139 mmol/L (ref 134–144)
Total Protein: 7.4 g/dL (ref 6.0–8.5)

## 2017-10-08 LAB — C-REACTIVE PROTEIN: CRP: 4.8 mg/L (ref 0.0–4.9)

## 2017-10-08 LAB — THYROID ANTIBODIES
Thyroglobulin Antibody: 7.9 IU/mL — ABNORMAL HIGH (ref 0.0–0.9)
Thyroperoxidase Ab SerPl-aCnc: 342 IU/mL — ABNORMAL HIGH (ref 0–34)

## 2017-10-08 LAB — SEDIMENTATION RATE: Sed Rate: 25 mm/hr (ref 0–32)

## 2017-10-08 LAB — ANA: Anti Nuclear Antibody(ANA): NEGATIVE

## 2017-10-08 LAB — TRYPTASE: TRYPTASE: 2.9 ug/L (ref 2.2–13.2)

## 2017-10-11 ENCOUNTER — Telehealth: Payer: Self-pay | Admitting: Allergy & Immunology

## 2017-10-11 LAB — IGE+ALLERGENS ZONE 2(30)
Alternaria Alternata IgE: <0.1 kU/L
Amer Sycamore IgE Qn: <0.1 kU/L
Cedar, Mountain IgE: <0.1 kU/L
Cladosporium Herbarum IgE: <0.1 kU/L
Cockroach, American IgE: <0.1 kU/L
D Farinae IgE: <0.1 kU/L
D Pteronyssinus IgE: <0.1 kU/L
Dog Dander IgE: <0.1 kU/L
Hickory, White IgE: <0.1 kU/L
IgE (Immunoglobulin E), Serum: 27 IU/mL (ref 0–100)
Maple/Box Elder IgE: <0.1 kU/L
Mucor Racemosus IgE: <0.1 kU/L
Nettle IgE: <0.1 kU/L
Oak, White IgE: <0.1 kU/L
Penicillium Chrysogen IgE: <0.1 kU/L
Pigweed, Rough IgE: <0.1 kU/L
Plantain, English IgE: <0.1 kU/L
Ragweed, Short IgE: <0.1 kU/L
Sheep Sorrel IgE Qn: <0.1 kU/L
Sweet gum IgE RAST Ql: <0.1 kU/L

## 2017-10-11 LAB — SPECIMEN STATUS REPORT

## 2017-10-11 NOTE — Telephone Encounter (Signed)
Pt called and said that she is itching pretty bad and wants to know can she take Benadryl with the other meds you have her on. [facility]. [phone].

## 2017-10-11 NOTE — Telephone Encounter (Signed)
I spoke with Tammie [name] after reviewing the office note from 10-06-17. She has been taking 2 tabs of Allegra every morning and 2 tabs of Zyrtec every evening along with the montelukast 10 mg. She had not added on the ranitidine  300 mg qhs. She did not know if that would help or not. I advised her that in conjunction the other meds she is on it should help relieve the symptoms. I advised her to try that for 1 week and to call us back to let us know how she is doing.

## 2017-10-14 ENCOUNTER — Telehealth: Payer: Self-pay

## 2017-10-14 NOTE — Telephone Encounter (Signed)
I would recommend that she come in and start Xolair. We can add on a mid-day dose of cetirizine 20mg  as well.  Malachi BondsJoel Shalee Paolo, MD [facility]

## 2017-10-14 NOTE — Telephone Encounter (Signed)
Pt. Called stating that she continues to have itching. Not as bad as before but still bothersome. Is taking all meds as prescribed. Please advise.

## 2017-10-14 NOTE — Telephone Encounter (Signed)
Tried to contact patient no answer 

## 2017-10-14 NOTE — Telephone Encounter (Signed)
Patient called back and I went over instructions per Dr Dellis AnesGallagher and discussed Xolair. I will send patient info and Xolair and she is wanting to try medications for another week and she will contact me and let me know. At that time I will submit and maybe arrange sample to be given in [place] office

## 2017-10-14 NOTE — Telephone Encounter (Signed)
New start Xolair

## 2017-10-20 NOTE — Telephone Encounter (Signed)
Excellent - sounds like a good plan!   Malachi BondsJoel Lenee Franze, MD [facility]

## 2017-10-25 DIAGNOSIS — L299 Pruritus, unspecified: Secondary | ICD-10-CM | POA: Diagnosis not present

## 2017-10-25 DIAGNOSIS — Z1389 Encounter for screening for other disorder: Secondary | ICD-10-CM | POA: Diagnosis not present

## 2017-10-25 DIAGNOSIS — Z6838 Body mass index (BMI) 38.0-38.9, adult: Secondary | ICD-10-CM | POA: Diagnosis not present

## 2017-10-25 DIAGNOSIS — L509 Urticaria, unspecified: Secondary | ICD-10-CM | POA: Diagnosis not present

## 2017-10-26 DIAGNOSIS — M810 Age-related osteoporosis without current pathological fracture: Secondary | ICD-10-CM | POA: Diagnosis not present

## 2017-10-26 DIAGNOSIS — R062 Wheezing: Secondary | ICD-10-CM | POA: Diagnosis not present

## 2017-10-26 DIAGNOSIS — K219 Gastro-esophageal reflux disease without esophagitis: Secondary | ICD-10-CM | POA: Diagnosis not present

## 2017-10-26 DIAGNOSIS — R221 Localized swelling, mass and lump, neck: Secondary | ICD-10-CM | POA: Diagnosis not present

## 2017-10-26 DIAGNOSIS — Z79899 Other long term (current) drug therapy: Secondary | ICD-10-CM | POA: Diagnosis not present

## 2017-10-26 DIAGNOSIS — M1990 Unspecified osteoarthritis, unspecified site: Secondary | ICD-10-CM | POA: Diagnosis not present

## 2017-10-26 DIAGNOSIS — F17200 Nicotine dependence, unspecified, uncomplicated: Secondary | ICD-10-CM | POA: Diagnosis not present

## 2017-10-26 DIAGNOSIS — L509 Urticaria, unspecified: Secondary | ICD-10-CM | POA: Diagnosis not present

## 2017-10-27 DIAGNOSIS — Z886 Allergy status to analgesic agent status: Secondary | ICD-10-CM | POA: Diagnosis not present

## 2017-10-27 DIAGNOSIS — F410 Panic disorder [episodic paroxysmal anxiety] without agoraphobia: Secondary | ICD-10-CM | POA: Diagnosis not present

## 2017-10-27 DIAGNOSIS — Z9049 Acquired absence of other specified parts of digestive tract: Secondary | ICD-10-CM | POA: Diagnosis not present

## 2017-10-27 DIAGNOSIS — R05 Cough: Secondary | ICD-10-CM | POA: Diagnosis not present

## 2017-10-27 DIAGNOSIS — R0602 Shortness of breath: Secondary | ICD-10-CM | POA: Diagnosis not present

## 2017-10-27 DIAGNOSIS — M1990 Unspecified osteoarthritis, unspecified site: Secondary | ICD-10-CM | POA: Diagnosis not present

## 2017-10-27 DIAGNOSIS — Z6837 Body mass index (BMI) 37.0-37.9, adult: Secondary | ICD-10-CM | POA: Diagnosis not present

## 2017-10-27 DIAGNOSIS — L500 Allergic urticaria: Secondary | ICD-10-CM | POA: Diagnosis not present

## 2017-10-27 DIAGNOSIS — F17200 Nicotine dependence, unspecified, uncomplicated: Secondary | ICD-10-CM | POA: Diagnosis not present

## 2017-10-27 DIAGNOSIS — T7840XA Allergy, unspecified, initial encounter: Secondary | ICD-10-CM | POA: Diagnosis not present

## 2017-10-27 DIAGNOSIS — J449 Chronic obstructive pulmonary disease, unspecified: Secondary | ICD-10-CM | POA: Diagnosis not present

## 2017-10-27 DIAGNOSIS — Z79899 Other long term (current) drug therapy: Secondary | ICD-10-CM | POA: Diagnosis not present

## 2017-10-27 DIAGNOSIS — K219 Gastro-esophageal reflux disease without esophagitis: Secondary | ICD-10-CM | POA: Diagnosis not present

## 2017-10-27 DIAGNOSIS — K589 Irritable bowel syndrome without diarrhea: Secondary | ICD-10-CM | POA: Diagnosis not present

## 2017-10-27 DIAGNOSIS — R0789 Other chest pain: Secondary | ICD-10-CM | POA: Diagnosis not present

## 2017-10-27 DIAGNOSIS — R072 Precordial pain: Secondary | ICD-10-CM | POA: Diagnosis not present

## 2017-10-27 DIAGNOSIS — M810 Age-related osteoporosis without current pathological fracture: Secondary | ICD-10-CM | POA: Diagnosis not present

## 2017-10-28 DIAGNOSIS — R0602 Shortness of breath: Secondary | ICD-10-CM | POA: Diagnosis not present

## 2017-10-28 DIAGNOSIS — T7840XA Allergy, unspecified, initial encounter: Secondary | ICD-10-CM | POA: Diagnosis not present

## 2017-10-28 DIAGNOSIS — F410 Panic disorder [episodic paroxysmal anxiety] without agoraphobia: Secondary | ICD-10-CM | POA: Diagnosis not present

## 2017-10-28 DIAGNOSIS — L509 Urticaria, unspecified: Secondary | ICD-10-CM | POA: Diagnosis not present

## 2017-11-03 DIAGNOSIS — F419 Anxiety disorder, unspecified: Secondary | ICD-10-CM | POA: Diagnosis not present

## 2017-11-03 DIAGNOSIS — L509 Urticaria, unspecified: Secondary | ICD-10-CM | POA: Diagnosis not present

## 2017-11-03 DIAGNOSIS — Z6837 Body mass index (BMI) 37.0-37.9, adult: Secondary | ICD-10-CM | POA: Diagnosis not present

## 2017-11-03 DIAGNOSIS — L299 Pruritus, unspecified: Secondary | ICD-10-CM | POA: Diagnosis not present

## 2017-11-27 DIAGNOSIS — L509 Urticaria, unspecified: Secondary | ICD-10-CM | POA: Diagnosis not present

## 2017-11-27 DIAGNOSIS — L299 Pruritus, unspecified: Secondary | ICD-10-CM | POA: Diagnosis not present

## 2017-11-27 DIAGNOSIS — Z6837 Body mass index (BMI) 37.0-37.9, adult: Secondary | ICD-10-CM | POA: Diagnosis not present

## 2017-11-27 DIAGNOSIS — F419 Anxiety disorder, unspecified: Secondary | ICD-10-CM | POA: Diagnosis not present

## 2017-11-30 DIAGNOSIS — F419 Anxiety disorder, unspecified: Secondary | ICD-10-CM | POA: Diagnosis not present

## 2017-11-30 DIAGNOSIS — Z6838 Body mass index (BMI) 38.0-38.9, adult: Secondary | ICD-10-CM | POA: Diagnosis not present

## 2017-11-30 DIAGNOSIS — L509 Urticaria, unspecified: Secondary | ICD-10-CM | POA: Diagnosis not present

## 2017-11-30 DIAGNOSIS — J019 Acute sinusitis, unspecified: Secondary | ICD-10-CM | POA: Diagnosis not present

## 2017-12-29 DIAGNOSIS — Z1231 Encounter for screening mammogram for malignant neoplasm of breast: Secondary | ICD-10-CM | POA: Diagnosis not present

## 2018-03-21 DIAGNOSIS — L509 Urticaria, unspecified: Secondary | ICD-10-CM | POA: Diagnosis not present

## 2018-03-21 DIAGNOSIS — F419 Anxiety disorder, unspecified: Secondary | ICD-10-CM | POA: Diagnosis not present

## 2018-03-21 DIAGNOSIS — M7711 Lateral epicondylitis, right elbow: Secondary | ICD-10-CM | POA: Diagnosis not present

## 2018-03-21 DIAGNOSIS — M79672 Pain in left foot: Secondary | ICD-10-CM | POA: Diagnosis not present

## 2018-06-19 DIAGNOSIS — Z6841 Body Mass Index (BMI) 40.0 and over, adult: Secondary | ICD-10-CM | POA: Diagnosis not present

## 2018-06-19 DIAGNOSIS — L0390 Cellulitis, unspecified: Secondary | ICD-10-CM | POA: Diagnosis not present

## 2018-06-19 DIAGNOSIS — L0291 Cutaneous abscess, unspecified: Secondary | ICD-10-CM | POA: Diagnosis not present

## 2018-06-21 DIAGNOSIS — L02214 Cutaneous abscess of groin: Secondary | ICD-10-CM | POA: Diagnosis not present

## 2018-07-07 DIAGNOSIS — J019 Acute sinusitis, unspecified: Secondary | ICD-10-CM | POA: Diagnosis not present

## 2018-07-07 DIAGNOSIS — J209 Acute bronchitis, unspecified: Secondary | ICD-10-CM | POA: Diagnosis not present

## 2018-07-07 DIAGNOSIS — Z6841 Body Mass Index (BMI) 40.0 and over, adult: Secondary | ICD-10-CM | POA: Diagnosis not present

## 2018-08-21 ENCOUNTER — Other Ambulatory Visit: Payer: Self-pay | Admitting: Allergy & Immunology

## 2018-09-14 DIAGNOSIS — Z6841 Body Mass Index (BMI) 40.0 and over, adult: Secondary | ICD-10-CM | POA: Diagnosis not present

## 2018-09-14 DIAGNOSIS — N3000 Acute cystitis without hematuria: Secondary | ICD-10-CM | POA: Diagnosis not present

## 2018-09-29 ENCOUNTER — Emergency Department (HOSPITAL_COMMUNITY): Payer: BLUE CROSS/BLUE SHIELD

## 2018-09-29 ENCOUNTER — Emergency Department (HOSPITAL_COMMUNITY)
Admission: EM | Admit: 2018-09-29 | Discharge: 2018-09-30 | Disposition: A | Discharge status: Home / Self Care | Payer: BLUE CROSS/BLUE SHIELD | Attending: Emergency Medicine | Admitting: Emergency Medicine

## 2018-09-29 ENCOUNTER — Encounter (HOSPITAL_COMMUNITY): Payer: Self-pay

## 2018-09-29 ENCOUNTER — Other Ambulatory Visit: Payer: Self-pay

## 2018-09-29 DIAGNOSIS — R197 Diarrhea, unspecified: Secondary | ICD-10-CM

## 2018-09-29 DIAGNOSIS — F17200 Nicotine dependence, unspecified, uncomplicated: Secondary | ICD-10-CM | POA: Insufficient documentation

## 2018-09-29 DIAGNOSIS — R1010 Upper abdominal pain, unspecified: Secondary | ICD-10-CM | POA: Diagnosis not present

## 2018-09-29 DIAGNOSIS — R1013 Epigastric pain: Secondary | ICD-10-CM

## 2018-09-29 DIAGNOSIS — Z79899 Other long term (current) drug therapy: Secondary | ICD-10-CM | POA: Diagnosis not present

## 2018-09-29 LAB — COMPREHENSIVE METABOLIC PANEL
ALBUMIN: 4.1 g/dL (ref 3.5–5.0)
ALK PHOS: 52 U/L (ref 38–126)
ALT: 63 U/L — ABNORMAL HIGH (ref 0–44)
AST: 64 U/L — AB (ref 15–41)
Anion gap: 10 (ref 5–15)
BILIRUBIN TOTAL: 1.2 mg/dL (ref 0.3–1.2)
BUN: 16 mg/dL (ref 6–20)
CO2: 24 mmol/L (ref 22–32)
Calcium: 9.1 mg/dL (ref 8.9–10.3)
Chloride: 98 mmol/L (ref 98–111)
Creatinine, Ser: 0.72 mg/dL (ref 0.44–1.00)
GFR calc Af Amer: >60 mL/min (ref >60)
GLUCOSE: 128 mg/dL — AB (ref 70–99)
Potassium: 3.8 mmol/L (ref 3.5–5.1)
Sodium: 132 mmol/L — ABNORMAL LOW (ref 135–145)
TOTAL PROTEIN: 7.5 g/dL (ref 6.5–8.1)

## 2018-09-29 LAB — CBC
HCT: 49.1 % — ABNORMAL HIGH (ref 36.0–46.0)
Hemoglobin: 16.2 g/dL — ABNORMAL HIGH (ref 12.0–15.0)
MCH: 32.5 pg (ref 26.0–34.0)
MCHC: 33 g/dL (ref 30.0–36.0)
MCV: 98.4 fL (ref 80.0–100.0)
PLATELETS: 205 10*3/uL (ref 150–400)
RBC: 4.99 MIL/uL (ref 3.87–5.11)
RDW: 12.7 % (ref 11.5–15.5)
WBC: 11.4 10*3/uL — AB (ref 4.0–10.5)
nRBC: 0 % (ref 0.0–0.2)

## 2018-09-29 LAB — LIPASE, BLOOD: Lipase: 38 U/L (ref 11–51)

## 2018-09-29 MED ORDER — FENTANYL CITRATE (PF) 100 MCG/2ML IJ SOLN
50.0000 ug | Freq: Once | INTRAMUSCULAR | Status: AC
Start: 2018-09-29 — End: 2018-09-29
  Administered 2018-09-29: 50 ug via INTRAVENOUS
  Filled 2018-09-29: qty 2

## 2018-09-29 MED ORDER — IOPAMIDOL (ISOVUE-300) INJECTION 61%
100.0000 mL | Freq: Once | INTRAVENOUS | Status: AC | PRN
  Administered 2018-09-29: 100 mL via INTRAVENOUS

## 2018-09-29 MED ORDER — ONDANSETRON HCL 4 MG/2ML IJ SOLN
4.0000 mg | Freq: Once | INTRAMUSCULAR | Status: AC
  Administered 2018-09-29: 4 mg via INTRAVENOUS
  Filled 2018-09-29: qty 2

## 2018-09-29 MED ORDER — SODIUM CHLORIDE 0.9 % IV BOLUS
1000.0000 mL | Freq: Once | INTRAVENOUS | Status: AC
  Administered 2018-09-29: 1000 mL via INTRAVENOUS

## 2018-09-29 NOTE — ED Triage Notes (Signed)
Pt reports diarrhea that started 2 days ago, it has slowed down since taking Imodium about 3 pm. Upper Abdominal pain (across abd) with severe cramping started after that.  Pt says it feels "like her gallbladder, but she doesn't have one."

## 2018-09-29 NOTE — ED Provider Notes (Signed)
[facility] EMERGENCY DEPARTMENT Provider Note   CSN: [id number] Arrival date & time: 09/29/18  1843    History   Chief Complaint Chief Complaint  Patient presents with  . Abdominal Pain    HPI Tammie [name] is a 48 y.o. female.     HPI   Tammie [name] is a 48 y.o. female with a past medical history of frequent abdominal pain, vomiting and diarrhea, who presents to the Emergency Department complaining of upper abdominal pain and diarrhea.  She states that she has had multiple episodes of diarrhea that she describes as watery and brown in color.  She states that she took 1 Imodium tablet earlier today and developed pain to her upper abdomen shortly after.  Pain has been constant and she describes as nonradiating.  Her symptoms have been also been associated with nausea.  No vomiting.  She denies chest pain, shortness of breath, fever, chills, dysuria, flank pain or recent surgery or antibiotic use.   History reviewed. No pertinent past medical history.  Patient Active Problem List   Diagnosis Date Noted  . Chronic urticaria 10/06/2017  . Seasonal allergic rhinitis 10/06/2017  . RECTAL BLEEDING 11/07/2008  . NAUSEA 11/07/2008  . DIARRHEA 11/07/2008  . ABDOMINAL PAIN 11/07/2008  . EPIGASTRIC PAIN 11/07/2008  . HELICOBACTER PYLORI INFECTION, HX OF 11/07/2008    Past Surgical History:  Procedure Laterality Date  . CHOLECYSTECTOMY       OB History   No obstetric history on file.      Home Medications    Prior to Admission medications   Medication Sig Start Date End Date Taking? Authorizing Provider  cetirizine (ZYRTEC) 10 MG tablet Take 2 tablets (20 mg total) by mouth daily. 10/06/17   Alfonse Spruce, MD  fexofenadine (ALLEGRA) 180 MG tablet Take 2 tablets (360 mg total) by mouth daily. 10/06/17   Alfonse Spruce, MD  meloxicam (MOBIC) 15 MG tablet Take 15 mg by mouth daily. 09/14/17   [provider]  montelukast (SINGULAIR) 10 MG tablet  TAKE ONE TABLET BY MOUTH AT BEDTIME 08/21/18   Alfonse Spruce, MD  omeprazole (PRILOSEC) 20 MG capsule Take 20 mg by mouth daily.    [provider]  ranitidine (ZANTAC) 300 MG tablet Take 1 tablet (300 mg total) by mouth at bedtime. 10/06/17   Alfonse Spruce, MD    Family History No family history on file.  Social History Social History   Tobacco Use  . Smoking status: Current Every Day Smoker    Packs/day: 0.50  . Smokeless tobacco: Never Used  Substance Use Topics  . Alcohol use: No    Frequency: Never  . Drug use: No     Allergies   Aspirin and Codeine   Review of Systems Review of Systems  Constitutional: Negative for appetite change, chills and fever.  HENT: Negative for sore throat.   Respiratory: Negative for shortness of breath.   Cardiovascular: Negative for chest pain.  Gastrointestinal: Positive for abdominal pain, diarrhea and nausea. Negative for abdominal distention, blood in stool and vomiting.  Genitourinary: Negative for decreased urine volume, dysuria and flank pain.  Musculoskeletal: Negative for back pain.  Skin: Negative for color change and rash.  Neurological: Negative for dizziness, weakness and numbness.  Hematological: Negative for adenopathy.     Physical Exam Updated Vital Signs BP 125/78 (BP Location: Right Arm)   Pulse 89   Temp 98.7 F (37.1 C) (Oral)   Resp 20  Ht 5\' 4"  (1.626 m)   Wt 108.9 kg   SpO2 99%   BMI 41.20 kg/m   Physical Exam Vitals signs and nursing note reviewed.  Constitutional:      General: She is not in acute distress.    Appearance: She is obese. She is not ill-appearing.  HENT:     Mouth/Throat:     Mouth: Mucous membranes are moist.     Pharynx: Oropharynx is clear.  Neck:     Musculoskeletal: No neck rigidity or muscular tenderness.  Cardiovascular:     Rate and Rhythm: Normal rate and regular rhythm.     Heart sounds: No murmur.  Pulmonary:     Effort: Pulmonary effort is  normal.     Breath sounds: Normal breath sounds.  Abdominal:     General: There is no distension.     Palpations: Abdomen is soft.     Tenderness: There is abdominal tenderness in the epigastric area. Negative signs include McBurney's sign.     Comments: Mild to moderate tenderness at the epigastrium.  No guarding or rebound.  Remaining abdomen is soft and nontender.  Musculoskeletal: Normal range of motion.  Skin:    General: Skin is warm.     Findings: No rash.  Neurological:     General: No focal deficit present.     Mental Status: She is alert.     Sensory: No sensory deficit.     Motor: No weakness.      ED Treatments / Results  Labs (all labs ordered are listed, but only abnormal results are displayed) Labs Reviewed  COMPREHENSIVE METABOLIC PANEL - Abnormal; Notable for the following components:      Result Value   Sodium 132 (*)    Glucose, Bld 128 (*)    AST 64 (*)    ALT 63 (*)    All other components within normal limits  CBC - Abnormal; Notable for the following components:   WBC 11.4 (*)    Hemoglobin 16.2 (*)    HCT 49.1 (*)    All other components within normal limits  LIPASE, BLOOD  URINALYSIS, ROUTINE W REFLEX MICROSCOPIC    EKG None  Radiology Ct Abdomen Pelvis W Contrast  Result Date: 09/30/2018 CLINICAL DATA:  Diarrhea abdominal pain EXAM: CT ABDOMEN AND PELVIS WITH CONTRAST TECHNIQUE: Multidetector CT imaging of the abdomen and pelvis was performed using the standard protocol following bolus administration of intravenous contrast. CONTRAST:  100mL ISOVUE-300 IOPAMIDOL (ISOVUE-300) INJECTION 61% COMPARISON:  Report 06/04/2011 FINDINGS: Lower chest: Lung bases demonstrate no acute consolidation or effusion. Normal heart size Hepatobiliary: Hepatic steatosis. Status post cholecystectomy. No biliary dilatation Pancreas: Unremarkable. No pancreatic ductal dilatation or surrounding inflammatory changes. Spleen: Normal in size without focal abnormality.  Adrenals/Urinary Tract: Adrenal glands are unremarkable. Kidneys are normal, without renal calculi, focal lesion, or hydronephrosis. Slight thickening at the dome of the bladder with linear fibrous tract extending toward the umbilical region consistent with urachal remnant. Stomach/Bowel: Stomach is within normal limits. No evidence of bowel wall thickening, distention, or inflammatory changes. Vascular/Lymphatic: Mild aortic atherosclerosis. No aneurysm. No significantly enlarged lymph nodes. Reproductive: Uterus and bilateral adnexa are unremarkable. Other: Negative for free air or free fluid. Musculoskeletal: Degenerative changes. No acute or suspicious abnormality IMPRESSION: No CT evidence for acute intra-abdominal or pelvic abnormality. Hepatic steatosis Electronically Signed   By: Jasmine PangKim  Fujinaga M.D.   On: 09/30/2018 00:16    Procedures Procedures (including critical care time)  Medications Ordered in  ED Medications  sodium chloride 0.9 % bolus 1,000 mL (1,000 mLs Intravenous New Bag/Given 09/29/18 2220)  ondansetron (ZOFRAN) injection 4 mg (4 mg Intravenous Given 09/29/18 2220)  fentaNYL (SUBLIMAZE) injection 50 mcg (50 mcg Intravenous Given 09/29/18 2221)     Initial Impression / Assessment and Plan / ED Course  I have reviewed the triage vital signs and the nursing notes.  Pertinent labs & imaging results that were available during my care of the patient were reviewed by me and considered in my medical decision making (see chart for details).        Patient well-appearing, nontoxic.  Vitals reassuring.  Pain improved after IV pain medication.  0030  On recheck, pt reports feeling better.  No vomiting or further diarrhea.  Labs and CT abd/pelvis are reassuring. Doubt surgical abdomen. Sx's felt to be viral.  She agrees to tx plan and close out pt f/u.  Strict return precautions discussed.   Final Clinical Impressions(s) / ED Diagnoses   Final diagnoses:  Diarrhea, unspecified type    Epigastric pain    ED Discharge Orders    None       Shauntrice, Grabe 09/30/18 0044    Donnetta Hutching, MD 09/30/18 [facility]

## 2018-09-29 NOTE — ED Notes (Signed)
Pt ambulatory to bathroom and back to room. Steady gait. NAD

## 2018-09-29 NOTE — ED Notes (Signed)
Patient transported to CT 

## 2018-09-30 DIAGNOSIS — R197 Diarrhea, unspecified: Secondary | ICD-10-CM | POA: Diagnosis not present

## 2018-09-30 LAB — URINALYSIS, ROUTINE W REFLEX MICROSCOPIC
BACTERIA UA: NONE SEEN
BILIRUBIN URINE: NEGATIVE
Glucose, UA: NEGATIVE mg/dL
KETONES UR: NEGATIVE mg/dL
LEUKOCYTE UA: NEGATIVE
NITRITE: NEGATIVE
PROTEIN: NEGATIVE mg/dL
pH: 5 (ref 5.0–8.0)

## 2018-09-30 MED ORDER — METOCLOPRAMIDE HCL 5 MG/ML IJ SOLN
10.0000 mg | Freq: Once | INTRAMUSCULAR | Status: AC
  Administered 2018-09-30: 10 mg via INTRAVENOUS
  Filled 2018-09-30: qty 2

## 2018-09-30 MED ORDER — PANTOPRAZOLE SODIUM 20 MG PO TBEC: 20.0000 mg | DELAYED_RELEASE_TABLET | Freq: Two times a day (BID) | ORAL | 0 refills | Status: DC

## 2018-09-30 MED ORDER — PROMETHAZINE HCL 25 MG PO TABS: 25.0000 mg | ORAL_TABLET | Freq: Three times a day (TID) | ORAL | 0 refills | Status: DC | PRN

## 2018-09-30 NOTE — Discharge Instructions (Addendum)
Bland diet as tolerated.  Bananas, rice, applesauce, and toast or foods that help control diarrhea.  Take the Protonix as directed for 2 weeks.  Follow-up with your primary doctor for recheck.  Return to the ER for any worsening symptoms such as increasing abdominal pain, fever, vomiting, or bloody stools.

## 2018-10-02 DIAGNOSIS — R1013 Epigastric pain: Secondary | ICD-10-CM | POA: Diagnosis not present

## 2018-10-02 DIAGNOSIS — L509 Urticaria, unspecified: Secondary | ICD-10-CM | POA: Diagnosis not present

## 2018-10-02 DIAGNOSIS — Z6841 Body Mass Index (BMI) 40.0 and over, adult: Secondary | ICD-10-CM | POA: Diagnosis not present

## 2018-10-03 DIAGNOSIS — R197 Diarrhea, unspecified: Secondary | ICD-10-CM | POA: Diagnosis not present

## 2018-10-04 DIAGNOSIS — R197 Diarrhea, unspecified: Secondary | ICD-10-CM | POA: Diagnosis not present

## 2018-10-09 DIAGNOSIS — L509 Urticaria, unspecified: Secondary | ICD-10-CM | POA: Diagnosis not present

## 2018-10-09 DIAGNOSIS — R1013 Epigastric pain: Secondary | ICD-10-CM | POA: Diagnosis not present

## 2018-10-09 DIAGNOSIS — Z6841 Body Mass Index (BMI) 40.0 and over, adult: Secondary | ICD-10-CM | POA: Diagnosis not present

## 2018-12-05 DIAGNOSIS — N3000 Acute cystitis without hematuria: Secondary | ICD-10-CM | POA: Diagnosis not present

## 2018-12-05 DIAGNOSIS — R300 Dysuria: Secondary | ICD-10-CM | POA: Diagnosis not present

## 2018-12-05 DIAGNOSIS — Z6841 Body Mass Index (BMI) 40.0 and over, adult: Secondary | ICD-10-CM | POA: Diagnosis not present

## 2019-01-18 DIAGNOSIS — Z6841 Body Mass Index (BMI) 40.0 and over, adult: Secondary | ICD-10-CM | POA: Diagnosis not present

## 2019-01-18 DIAGNOSIS — M5431 Sciatica, right side: Secondary | ICD-10-CM | POA: Diagnosis not present

## 2019-03-05 DIAGNOSIS — M5431 Sciatica, right side: Secondary | ICD-10-CM | POA: Diagnosis not present

## 2019-03-05 DIAGNOSIS — Z6841 Body Mass Index (BMI) 40.0 and over, adult: Secondary | ICD-10-CM | POA: Diagnosis not present

## 2019-03-15 DIAGNOSIS — Z886 Allergy status to analgesic agent status: Secondary | ICD-10-CM | POA: Diagnosis not present

## 2019-03-15 DIAGNOSIS — R109 Unspecified abdominal pain: Secondary | ICD-10-CM | POA: Diagnosis not present

## 2019-03-15 DIAGNOSIS — F17210 Nicotine dependence, cigarettes, uncomplicated: Secondary | ICD-10-CM | POA: Diagnosis not present

## 2019-03-15 DIAGNOSIS — Z79899 Other long term (current) drug therapy: Secondary | ICD-10-CM | POA: Diagnosis not present

## 2019-03-15 DIAGNOSIS — Z885 Allergy status to narcotic agent status: Secondary | ICD-10-CM | POA: Diagnosis not present

## 2019-03-15 DIAGNOSIS — K589 Irritable bowel syndrome without diarrhea: Secondary | ICD-10-CM | POA: Diagnosis not present

## 2019-03-15 DIAGNOSIS — M810 Age-related osteoporosis without current pathological fracture: Secondary | ICD-10-CM | POA: Diagnosis not present

## 2019-03-15 DIAGNOSIS — K219 Gastro-esophageal reflux disease without esophagitis: Secondary | ICD-10-CM | POA: Diagnosis not present

## 2019-03-15 DIAGNOSIS — K5900 Constipation, unspecified: Secondary | ICD-10-CM | POA: Diagnosis not present

## 2019-04-04 IMAGING — CT CT ABD-PELV W/ CM
2 of 5 series · 17 of 46 positions shown, 19 images · IV contrast (iopamidol)
Comparison: Report 06/04/2011

CLINICAL DATA: Diarrhea abdominal pain

EXAM:
CT ABDOMEN AND PELVIS WITH CONTRAST
TECHNIQUE: Multidetector CT imaging of the abdomen and pelvis was performed
using the standard protocol following bolus administration of
intravenous contrast.
CONTRAST:  100mL EN1SZU-522 IOPAMIDOL (EN1SZU-522) INJECTION 61%

[Series 2: axial st · axial · 0.98mm/px · z∈[+821,+1231]mm · 14 of 94 slices shown, 16 images]
[im 6/94  soft-tissue]
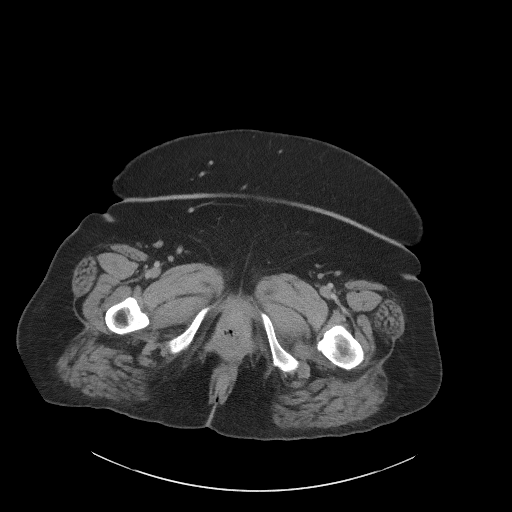
[im 6/94  bone]
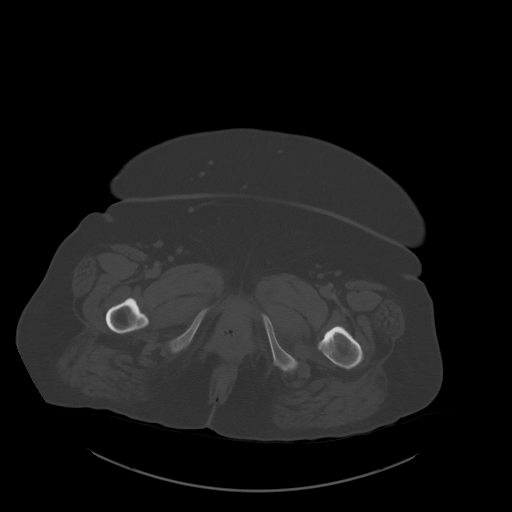
[im 12/94  soft-tissue]
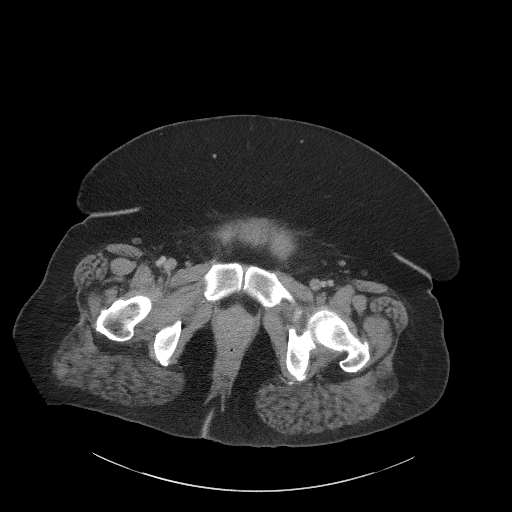
[im 18/94  soft-tissue]
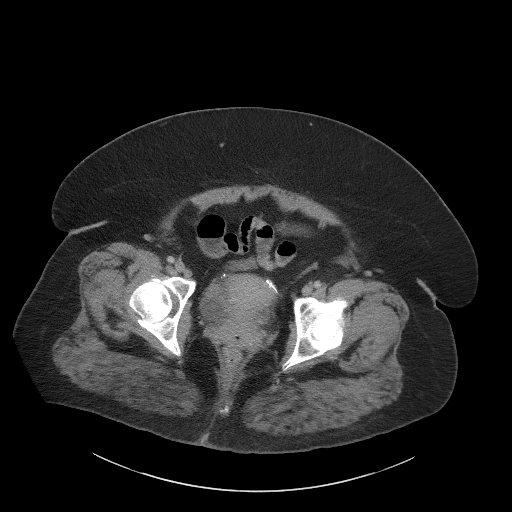
[im 24/94  soft-tissue]
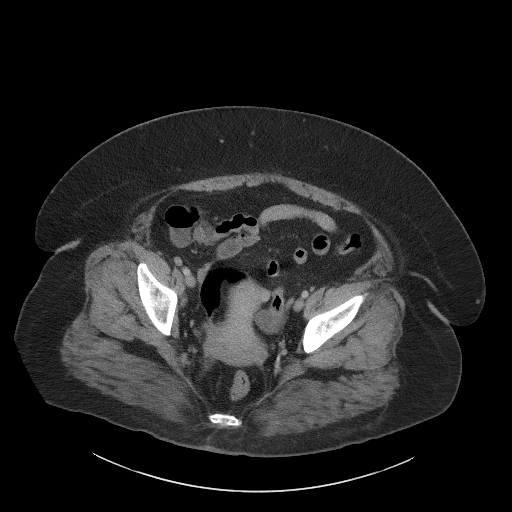
[im 30/94  soft-tissue]
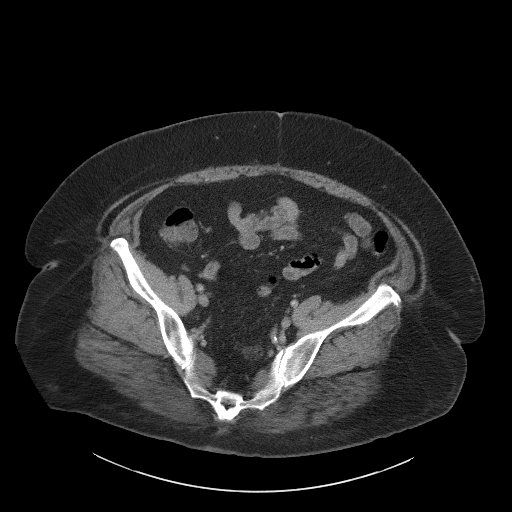
[im 35/94  soft-tissue]
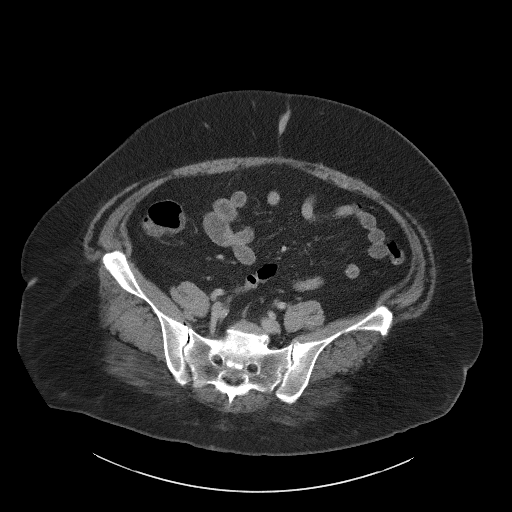
[im 41/94  soft-tissue]
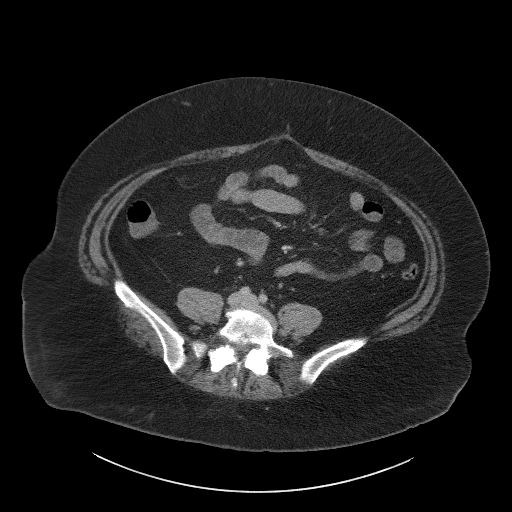
[im 53/94  soft-tissue]
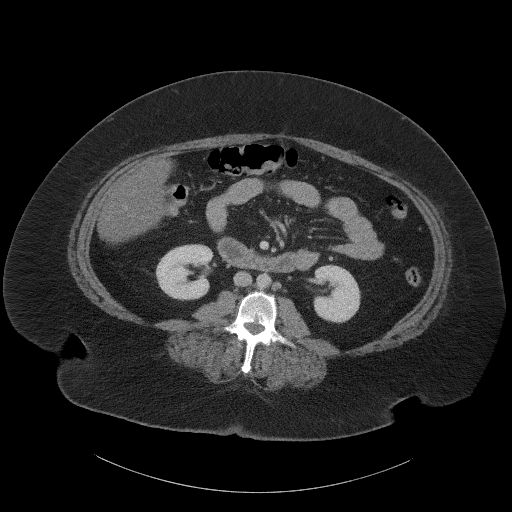
[im 59/94  soft-tissue]
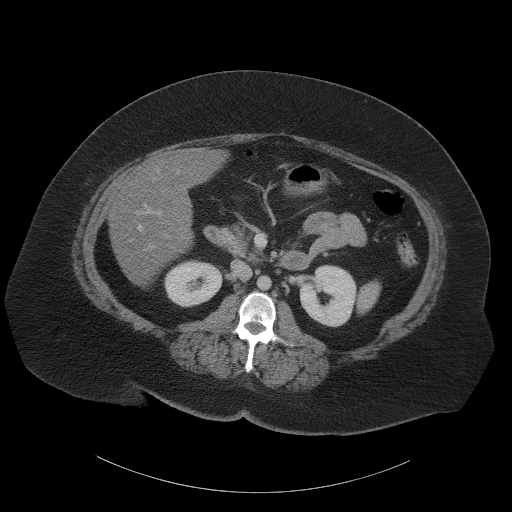
[im 59/94  bone]
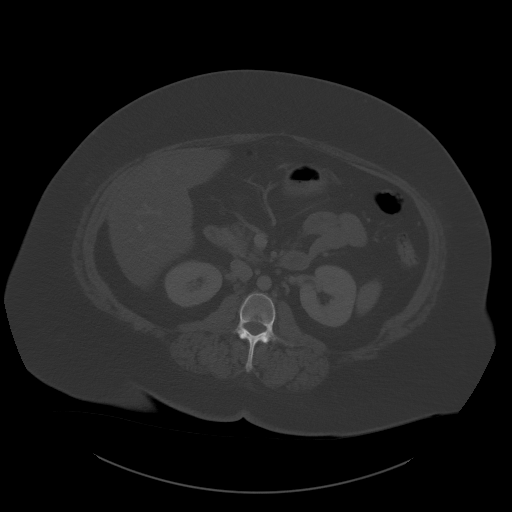
[im 64/94  soft-tissue]
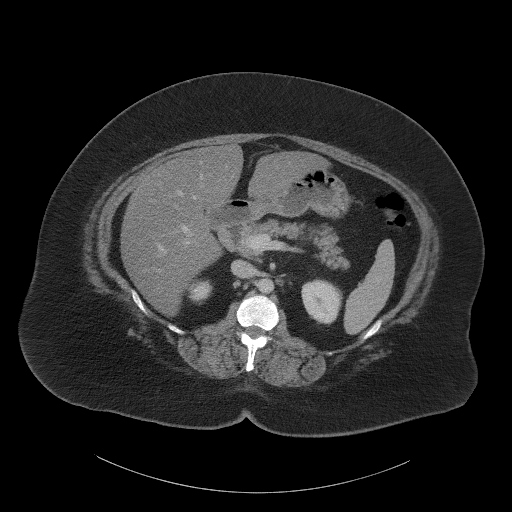
[im 70/94  soft-tissue]
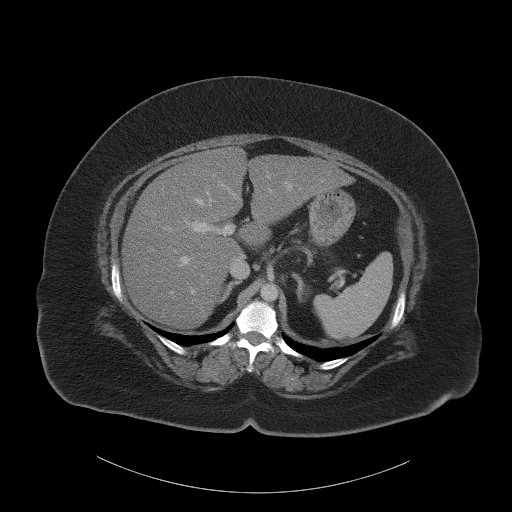
[im 76/94  soft-tissue]
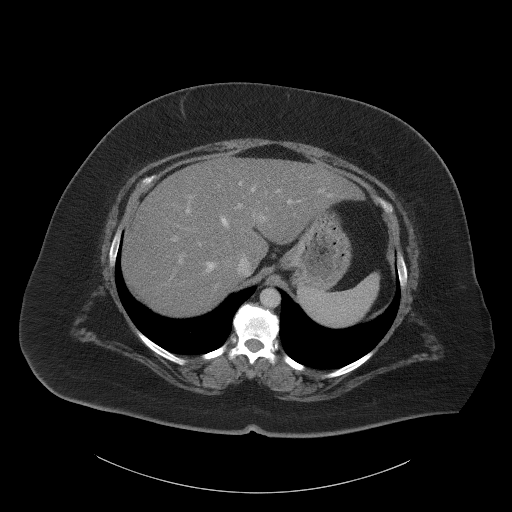
[im 82/94  soft-tissue]
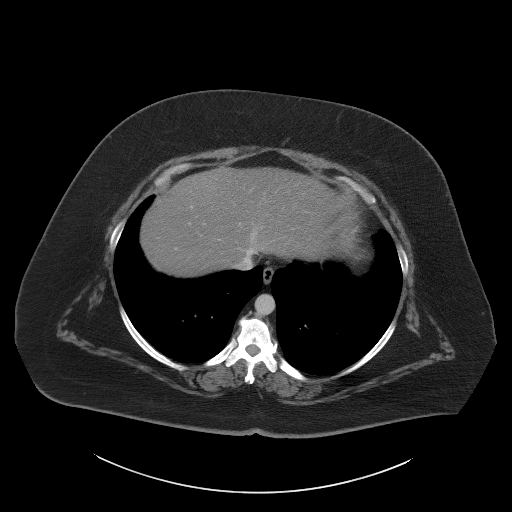
[im 88/94  soft-tissue]
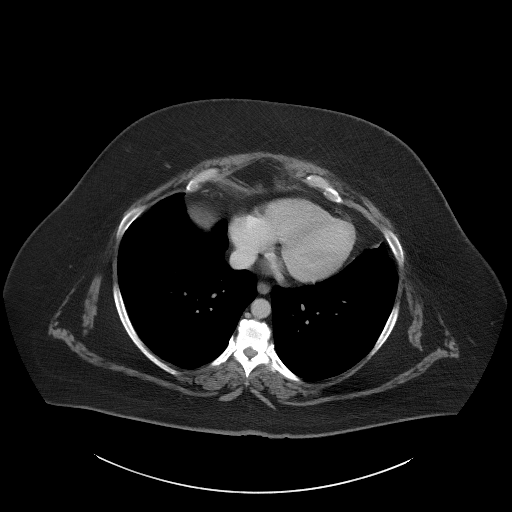

[Series 6: coronal st · coronal · 0.93mm/px · 3 of 138 slices shown]
[im 46/138  soft-tissue]
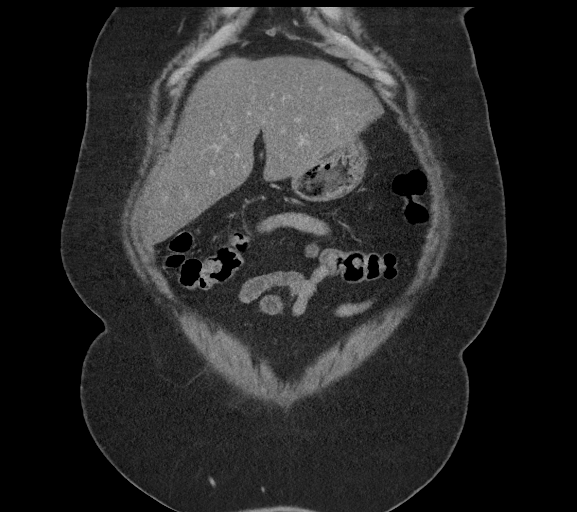
[im 61/138  soft-tissue]
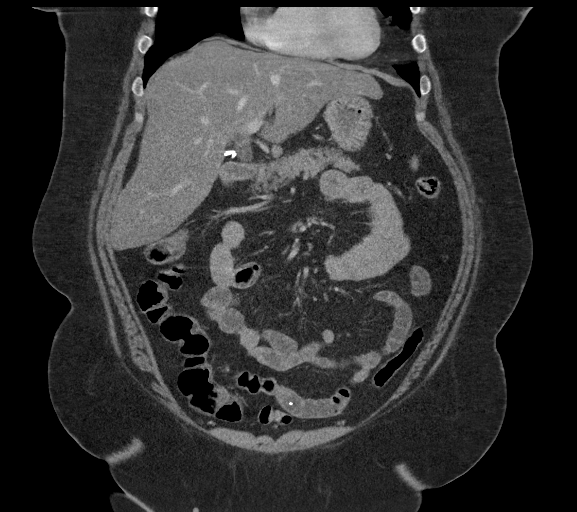
[im 77/138  soft-tissue]
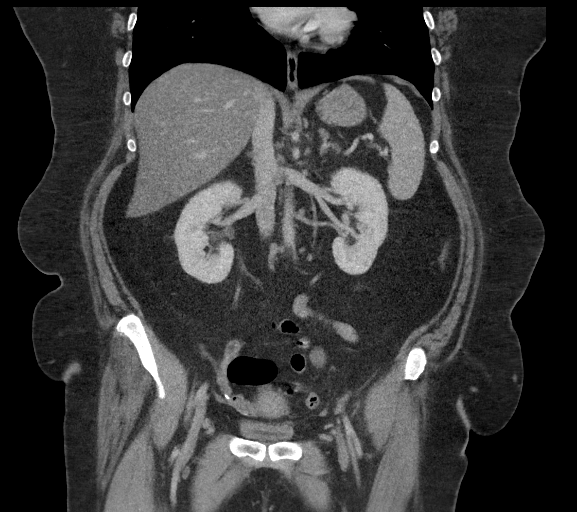

[17 of 46 positions shown; findings below may reference images not displayed]

FINDINGS: Lower chest: Lung bases demonstrate no acute consolidation or
effusion. Normal heart size

Hepatobiliary: Hepatic steatosis. Status post cholecystectomy. No
biliary dilatation

Pancreas: Unremarkable. No pancreatic ductal dilatation or
surrounding inflammatory changes.

Spleen: Normal in size without focal abnormality.

Adrenals/Urinary Tract: Adrenal glands are unremarkable. Kidneys are
normal, without renal calculi, focal lesion, or hydronephrosis.
Slight thickening at the dome of the bladder with linear fibrous
tract extending toward the umbilical region consistent with urachal
remnant.

Stomach/Bowel: Stomach is within normal limits. No evidence of bowel
wall thickening, distention, or inflammatory changes.

Vascular/Lymphatic: Mild aortic atherosclerosis. No aneurysm. No
significantly enlarged lymph nodes.

Reproductive: Uterus and bilateral adnexa are unremarkable.

Other: Negative for free air or free fluid.

Musculoskeletal: Degenerative changes. No acute or suspicious
abnormality
IMPRESSION: No CT evidence for acute intra-abdominal or pelvic abnormality.
Hepatic steatosis

## 2019-05-22 DIAGNOSIS — J209 Acute bronchitis, unspecified: Secondary | ICD-10-CM | POA: Diagnosis not present

## 2019-05-22 DIAGNOSIS — J019 Acute sinusitis, unspecified: Secondary | ICD-10-CM | POA: Diagnosis not present

## 2019-08-23 DIAGNOSIS — J069 Acute upper respiratory infection, unspecified: Secondary | ICD-10-CM | POA: Diagnosis not present

## 2019-08-23 DIAGNOSIS — Z20828 Contact with and (suspected) exposure to other viral communicable diseases: Secondary | ICD-10-CM | POA: Diagnosis not present

## 2019-09-20 DIAGNOSIS — R1013 Epigastric pain: Secondary | ICD-10-CM | POA: Diagnosis not present

## 2019-10-02 DIAGNOSIS — Z9049 Acquired absence of other specified parts of digestive tract: Secondary | ICD-10-CM | POA: Diagnosis not present

## 2019-10-02 DIAGNOSIS — R1010 Upper abdominal pain, unspecified: Secondary | ICD-10-CM | POA: Diagnosis not present

## 2019-10-02 DIAGNOSIS — R1013 Epigastric pain: Secondary | ICD-10-CM | POA: Diagnosis not present

## 2019-10-10 ENCOUNTER — Encounter: Payer: Self-pay | Admitting: Gastroenterology

## 2019-10-30 ENCOUNTER — Ambulatory Visit: Payer: BC Managed Care – PPO | Admitting: Nurse Practitioner

## 2019-10-30 ENCOUNTER — Encounter: Payer: Self-pay | Admitting: *Deleted

## 2019-10-30 ENCOUNTER — Encounter: Payer: Self-pay | Admitting: Nurse Practitioner

## 2019-10-30 ENCOUNTER — Encounter: Payer: Self-pay | Admitting: Gastroenterology

## 2019-10-30 ENCOUNTER — Other Ambulatory Visit: Payer: Self-pay

## 2019-10-30 VITALS — BP 149/95 | HR 90 | Temp 96.2°F | Ht 64.0 in | Wt 256.6 lb

## 2019-10-30 DIAGNOSIS — R1013 Epigastric pain: Secondary | ICD-10-CM

## 2019-10-30 DIAGNOSIS — R110 Nausea: Secondary | ICD-10-CM | POA: Diagnosis not present

## 2019-10-30 DIAGNOSIS — K760 Fatty (change of) liver, not elsewhere classified: Secondary | ICD-10-CM

## 2019-10-30 MED ORDER — PANTOPRAZOLE SODIUM 40 MG PO TBEC: 40.0000 mg | DELAYED_RELEASE_TABLET | Freq: Two times a day (BID) | ORAL | 5 refills | Status: DC

## 2019-10-30 NOTE — Assessment & Plan Note (Addendum)
The patient notes ongoing moderate epigastric tenderness.  She is on Protonix 20 mg twice daily over-the-counter.  This is not helped her pain and nausea.  However, it has improved/resolved her GERD symptoms.  H. pylori serologies appear normal.  She is on meloxicam.  At this point I will increase her PPI to 40 mg twice daily.  We will schedule her for an upper endoscopy for further evaluation of upper GI mucosa.  Printed information provided.  Follow-up in 4 months.  With the departure of Dr. Darrick Penna, Dr. Jena Gauss will perform the upper endoscopy in her absence.  Proceed with EGD with Dr. Jena Gauss in near future: the risks, benefits, and alternatives have been discussed with the patient in detail. The patient states understanding and desires to proceed.  The patient is currently on Lexapro 10 mg.  The patient is not on any other anticoagulants, anxiolytics, chronic pain medications, antidepressants, antidiabetics, or iron supplements.  Denies alcohol and drug use.  Conscious sedation should be adequate for procedure as it was in her last.

## 2019-10-30 NOTE — Progress Notes (Signed)
Primary Care Physician:  Rosine Door Primary Gastroenterologist:  Dr. Oneida Alar  Chief Complaint  Patient presents with  . Abdominal Pain    mid upper abd; x1 month  . Nausea    HPI:   Tammie [name] is a 49 y.o. female who presents on referral from primary care 10/10/2019 for abdominal pain and nausea. Patient has not been seen in our office since 2010. Reviewed information provided with referral including continuity of care document with information from office visit 09/20/2019 including epigastric abdominal pain with recommended labs and abdominal ultrasound, brat diet.   Reviewed labs dated 09/21/2019 including office visit dated 09/20/2019 for nausea that began 2 weeks prior.  Also noted epigastric abdominal pain.  Epigastric dull pain documented on exam.  Recommended labs and abdominal ultrasound.  Labs available in continuity of care documented in care everywhere with H. pylori IgG 0.55 (-).  C-Met with normal creatinine, normal alk phos of 60, mildly elevated AST/ALT at 83/63, normal bilirubin at 0.6.  Abdominal ultrasound completed 10/02/2019 which found increased liver echogenicity consistent with hepatic steatosis without focal lesion, gallbladder absent, otherwise normal.  No history of colonoscopy or endoscopy in our system.  Today she states she's doing ok overall. Started having epigastric discomfort and nausea for the past month, essentially intermittent with/without food. Pain described as "squeezing", only epigastric; denies esophageal burning, bitter taste. Nausea occurs about every time the pain hits. Denies vomiting. Denies other abdominal pain, hematochezia, melena. Chronic diarrhea for most of her adult life, especially since cholecystectomy. Dad "had trouble with his intestines a lot." Denies fever, chills, unintentional weight loss. Denies URI or flu-like symptoms. Denies loss of sense of taste or smell. Was last tested for COVID-19 about a month ago and was  negative. Has not gotten a vaccine yet. Denies chest pain, dyspnea, dizziness, lightheadedness, syncope, near syncope. Denies any other upper or lower GI symptoms.   Past Medical History:  Diagnosis Date  . Allergies   . Anxiety     Past Surgical History:  Procedure Laterality Date  . CHOLECYSTECTOMY    . CYSTECTOMY    . UTERINE ABLATION     per patient    Current Outpatient Medications  Medication Sig Dispense Refill  . escitalopram (LEXAPRO) 10 MG tablet Take 10 mg by mouth every morning.    . hydrOXYzine (VISTARIL) 25 MG capsule Take 25 mg by mouth at bedtime.    . hydrOXYzine (VISTARIL) 25 MG capsule Take 1 capsule by mouth daily.    Marland Kitchen loperamide (IMODIUM A-D) 2 MG tablet Take 2 mg by mouth once as needed for diarrhea or loose stools.    . meloxicam (MOBIC) 15 MG tablet Take 15 mg by mouth at bedtime.   5  . montelukast (SINGULAIR) 10 MG tablet Take 10 mg by mouth daily.    . Multiple Vitamin (MULTIVITAMIN WITH MINERALS) TABS tablet Take 1 tablet by mouth at bedtime.    . vitamin C (ASCORBIC ACID) 500 MG tablet Take 500 mg by mouth at bedtime.    . pantoprazole (PROTONIX) 40 MG tablet Take 1 tablet (40 mg total) by mouth 2 (two) times daily before a meal. 60 tablet 5   No current facility-administered medications for this visit.    Allergies as of 10/30/2019 - Review Complete 10/30/2019  Allergen Reaction Noted  . Aspirin Hives   . Codeine Nausea And Vomiting     Family History  Problem Relation Age of Onset  . Esophageal cancer  Paternal Grandfather   . Colon cancer Neg Hx     Social History   Socioeconomic History  . Marital status: Single    Spouse name: Not on file  . Number of children: Not on file  . Years of education: Not on file  . Highest education level: Not on file  Occupational History  . Not on file  Tobacco Use  . Smoking status: Current Every Day Smoker    Packs/day: 0.50  . Smokeless tobacco: Never Used  Substance and Sexual Activity  .  Alcohol use: No  . Drug use: No  . Sexual activity: Not on file  Other Topics Concern  . Not on file  Social History Narrative  . Not on file   Social Determinants of Health   Financial Resource Strain:   . Difficulty of Paying Living Expenses:   Food Insecurity:   . Worried About Charity fundraiser in the Last Year:   . Arboriculturist in the Last Year:   Transportation Needs:   . Film/video editor (Medical):   Marland Kitchen Lack of Transportation (Non-Medical):   Physical Activity:   . Days of Exercise per Week:   . Minutes of Exercise per Session:   Stress:   . Feeling of Stress :   Social Connections:   . Frequency of Communication with Friends and Family:   . Frequency of Social Gatherings with Friends and Family:   . Attends Religious Services:   . Active Member of Clubs or Organizations:   . Attends Archivist Meetings:   Marland Kitchen Marital Status:   Intimate Partner Violence:   . Fear of Current or Ex-Partner:   . Emotionally Abused:   Marland Kitchen Physically Abused:   . Sexually Abused:     Review of Systems: Review of Systems  Constitutional: Negative for chills, fever, malaise/fatigue and weight loss.  HENT: Negative for congestion and sore throat.   Respiratory: Negative for cough and shortness of breath.   Cardiovascular: Negative for chest pain and palpitations.  Gastrointestinal: Positive for abdominal pain, diarrhea and nausea. Negative for blood in stool, melena and vomiting.  Musculoskeletal: Negative for joint pain and myalgias.  Skin: Negative for rash.  Neurological: Negative for dizziness and weakness.  Endo/Heme/Allergies: Does not bruise/bleed easily.  Psychiatric/Behavioral: Negative for depression. The patient is not nervous/anxious.   All other systems reviewed and are negative.     BP (!) 149/95   Pulse 90   Temp (!) 96.2 F (35.7 C) (Temporal)   Ht 5' 4"  (1.626 m)   Wt 256 lb 9.6 oz (116.4 kg)   BMI 44.05 kg/m  Physical Exam Vitals and  nursing note reviewed.  Constitutional:      General: She is not in acute distress.    Appearance: She is well-developed. She is obese. She is not ill-appearing, toxic-appearing or diaphoretic.  HENT:     Head: Normocephalic and atraumatic.  Cardiovascular:     Rate and Rhythm: Normal rate and regular rhythm.     Heart sounds: Normal heart sounds.  Pulmonary:     Effort: Pulmonary effort is normal. No respiratory distress.     Breath sounds: Normal breath sounds.  Abdominal:     General: Abdomen is protuberant. Bowel sounds are normal. There is no distension.     Palpations: Abdomen is soft. There is no hepatomegaly, splenomegaly or mass.     Tenderness: There is abdominal tenderness in the epigastric area. There is no guarding or  rebound.     Hernia: No hernia is present.    Skin:    General: Skin is warm and dry.     Coloration: Skin is not jaundiced.     Findings: No rash.  Neurological:     General: No focal deficit present.     Mental Status: She is alert and oriented to person, place, and time.  Psychiatric:        Attention and Perception: Attention normal.        Mood and Affect: Mood normal. Mood is not anxious or depressed.        Speech: Speech normal.        Behavior: Behavior normal.        Thought Content: Thought content normal.        Cognition and Memory: Cognition and memory normal.       10/30/2019 9:57 AM   Disclaimer: This note was dictated with voice recognition software. Similar sounding words can inadvertently be transcribed and may not be corrected upon review.

## 2019-10-30 NOTE — Patient Instructions (Addendum)
Your health issues we discussed today were:   Upper abdominal pain with nausea: 1. I have sent a prescription to your pharmacy for Protonix 40 mg.  Take this twice a day, pursing in the morning before you eat and 30 minutes before your last meal the day 2. We will schedule your upper endoscopy for you 3. Call us if you have any worsening or severe symptoms and we can plan for other evaluations or medications in the interim  Fatty liver disease: 1. As we discussed, diet and exercise are the cornerstone treatments for fatty liver disease 2. Unfortunately there is no medications to treat fatty liver disease at this time 3. Further printed information is below 4. Call us if you would like to see a dietitian and we can absolutely send you for referral.  Overall I recommend:  1. Continue your other current medications 2. Return for follow-up in 4 months 3. Call us if you have any questions or concerns.   ---------------------------------------------------------------  COVID-19 Vaccine Information can be found at: [email or web address] For questions related to vaccine distribution or appointments, please email [email or web address] or call [phone].   ---------------------------------------------------------------   At [facility] we value your feedback. You may receive a survey about your visit today. Please share your experience as we strive to create trusting relationships with our patients to provide genuine, compassionate, quality care.  We appreciate your understanding and patience as we review any laboratory studies, imaging, and other diagnostic tests that are ordered as we care for you. Our office policy is 5 business days for review of these results, and any emergent or urgent results are addressed in a timely manner for your best interest. If you do not hear from our office in 1 week, please contact us.    We also encourage the use of MyChart, which contains your medical information for your review as well. If you are not enrolled in this feature, an access code is on this after visit summary for your convenience. Thank you for allowing [place] to be involved in your care.  It was great to see you today!  I hope you have a great day!!      Fatty Liver Disease  Fatty liver disease occurs when too much fat has built up in your liver cells. Fatty liver disease is also called hepatic steatosis or steatohepatitis. The liver removes harmful substances from your bloodstream and produces fluids that your body needs. It also helps your body use and store energy from the food you eat. In many cases, fatty liver disease does not cause symptoms or problems. It is often diagnosed when tests are being done for other reasons. However, over time, fatty liver can cause inflammation that may lead to more serious liver problems, such as scarring of the liver (cirrhosis) and liver failure. Fatty liver is associated with insulin resistance, increased body fat, high blood pressure (hypertension), and high cholesterol. These are features of metabolic syndrome and increase your risk for stroke, diabetes, and heart disease. What are the causes? This condition may be caused by:  Drinking too much alcohol.  Poor nutrition.  Obesity.  Cushing's syndrome.  Diabetes.  High cholesterol.  Certain drugs.  Poisons.  Some viral infections.  Pregnancy. What increases the risk? You are more likely to develop this condition if you:  Abuse alcohol.  Are overweight.  Have diabetes.  Have hepatitis.  Have a high triglyceride level.  Are pregnant. What are the signs or symptoms? Fatty liver disease often does  not cause symptoms. If symptoms do develop, they can include:  Fatigue.  Weakness.  Weight loss.  Confusion.  Abdominal pain.  Nausea and vomiting.  Yellowing of your skin and the white parts  of your eyes (jaundice).  Itchy skin. How is this diagnosed? This condition may be diagnosed by:  A physical exam and medical history.  Blood tests.  Imaging tests, such as an ultrasound, CT scan, or MRI.  A liver biopsy. A small sample of liver tissue is removed using a needle. The sample is then looked at under a microscope. How is this treated? Fatty liver disease is often caused by other health conditions. Treatment for fatty liver may involve medicines and lifestyle changes to manage conditions such as:  Alcoholism.  High cholesterol.  Diabetes.  Being overweight or obese. Follow these instructions at home:   Do not drink alcohol. If you have trouble quitting, ask your health care provider how to safely quit with the help of medicine or a supervised program. This is important to keep your condition from getting worse.  Eat a healthy diet as told by your health care provider. Ask your health care provider about working with a diet and nutrition specialist (dietitian) to develop an eating plan.  Exercise regularly. This can help you lose weight and control your cholesterol and diabetes. Talk to your health care provider about an exercise plan and which activities are best for you.  Take over-the-counter and prescription medicines only as told by your health care provider.  Keep all follow-up visits as told by your health care provider. This is important. Contact a health care provider if: You have trouble controlling your:  Blood sugar. This is especially important if you have diabetes.  Cholesterol.  Drinking of alcohol. Get help right away if:  You have abdominal pain.  You have jaundice.  You have nausea and vomiting.  You vomit blood or material that looks like coffee grounds.  You have stools that are black, tar-like, or bloody. Summary  Fatty liver disease develops when too much fat builds up in the cells of your liver.  Fatty liver disease often  causes no symptoms or problems. However, over time, fatty liver can cause inflammation that may lead to more serious liver problems, such as scarring of the liver (cirrhosis).  You are more likely to develop this condition if you abuse alcohol, are pregnant, are overweight, have diabetes, have hepatitis, or have high triglyceride levels.  Contact your health care provider if you have trouble controlling your weight, blood sugar, cholesterol, or drinking of alcohol. This information is not intended to replace advice given to you by your health care provider. Make sure you discuss any questions you have with your health care provider. Document Revised: 07/08/2017 Document Reviewed: 05/04/2017 Elsevier Patient Education  2020 [place].    Nonalcoholic Fatty Liver Disease Diet, Adult Nonalcoholic fatty liver disease is a condition that causes fat to build up in and around the liver. The disease makes it harder for the liver to work the way that it should. Following a healthy diet can help to keep nonalcoholic fatty liver disease under control. It can also help to prevent or improve conditions that are associated with the disease, such as heart disease, diabetes, high blood pressure, and abnormal cholesterol levels. Along with regular exercise, this diet:  Promotes weight loss.  Helps to control blood sugar levels.  Helps to improve the way that the body uses insulin. What are tips for following  this plan? Reading food labels Always check food labels for:  The amount of saturated fat in a food. You should limit your intake of saturated fat. Saturated fat is found in foods that come from animals, including meat and dairy products such as butter, cheese, and whole milk.  The amount of fiber in a food. You should choose high-fiber foods such as fruits, vegetables, and whole grains. Try to get 25-30 grams (g) of fiber a day.  Cooking  When cooking, use heart-healthy oils that are high in  monounsaturated fats. These include olive oil, canola oil, and avocado oil.  Limit frying or deep-frying foods. Cook foods using healthy methods such as baking, boiling, steaming, and grilling instead. Meal planning  You may want to keep track of how many calories you take in. Eating the right amount of calories will help you achieve a healthy weight. Meeting with a registered dietitian can help you get started.  Limit how often you eat takeout and fast food. These foods are usually very high in fat, salt, and sugar.  Use the glycemic index (GI) to plan your meals. The index tells you how quickly a food will raise your blood sugar. Choose low-GI foods (GI less than 55). These foods take a longer time to raise blood sugar. A registered dietitian can help you identify foods lower on the GI scale. Lifestyle  You may want to follow a Mediterranean diet. This diet includes a lot of vegetables, lean meats or fish, whole grains, fruits, and healthy oils and fats. What foods can I eat?  Fruits Bananas. Apples. Oranges. Grapes. Papaya. Mango. Pomegranate. Kiwi. Grapefruit. Cherries. Vegetables Lettuce. Spinach. Peas. Beets. Cauliflower. Cabbage. Broccoli. Carrots. Tomatoes. Squash. Eggplant. Herbs. Peppers. Onions. Cucumbers. Brussels sprouts. Yams and sweet potatoes. Beans. Lentils. Grains Whole wheat or whole-grain foods, including breads, crackers, cereals, and pasta. Stone-ground whole wheat. Unsweetened oatmeal. Bulgur. Barley. Quinoa. Brown or wild rice. Corn or whole wheat flour tortillas. Meats and other proteins Lean meats. Poultry. Tofu. Seafood and shellfish. Dairy Low-fat or fat-free dairy products, such as yogurt, cottage cheese, or cheese. Beverages Water. Sugar-free drinks. Tea. Coffee. Low-fat or skim milk. Milk alternatives, such as soy or almond milk. Real fruit juice. Fats and oils Avocado. Canola or olive oil. Nuts and nut butters. Seeds. Seasonings and condiments Mustard.  Relish. Low-fat, low-sugar ketchup and barbecue sauce. Low-fat or fat-free mayonnaise. Sweets and desserts Sugar-free sweets. The items listed above may not be a complete list of foods and beverages you can eat. Contact a dietitian for more information. What foods should I limit or avoid? Meats and other proteins Limit red meat to 1-2 times a week. Dairy [employer]. Fats and oils Palm oil and coconut oil. Fried foods. Other foods Processed foods. Foods that contain a lot of salt or sodium. Sweets and desserts Sweets that contain sugar. Beverages Sweetened drinks, such as sweet tea, milkshakes, iced sweet drinks, and sodas. Alcohol. The items listed above may not be a complete list of foods and beverages you should avoid. Contact a dietitian for more information. Where to find more information The [employer] of Diabetes and Digestive and Kidney Diseases: [email or web address] Summary  Nonalcoholic fatty liver disease is a condition that causes fat to build up in and around the liver.  Following a healthy diet can help to keep nonalcoholic fatty liver disease under control. Your diet should be rich in fruits, vegetables, whole grains, and lean proteins.  Limit your intake of saturated fat. Saturated fat is  found in foods that come from animals, including meat and dairy products such as butter, cheese, and whole milk.  This diet promotes weight loss, helps to control blood sugar levels, and helps to improve the way that the body uses insulin. This information is not intended to replace advice given to you by your health care provider. Make sure you discuss any questions you have with your health care provider. Document Revised: 11/17/2018 Document Reviewed: 08/17/2018 Elsevier Patient Education  2020 [employer].

## 2019-10-30 NOTE — Assessment & Plan Note (Signed)
Nausea associated with epigastric pain.  No vomiting.  Symptoms likely related.  GERD has improved on over-the-counter low-dose PPI.  I increased her dose as per below.  Hopefully this will help her nausea.  She does have Phenergan as needed.  Proceed with upper endoscopy as per below for further evaluation.  Follow-up in 4 months.

## 2019-10-30 NOTE — Assessment & Plan Note (Signed)
Based on endoscopic findings indicative of nonalcoholic fatty liver disease/hepatic steatosis.  I have discussed with the patient the tenets of fatty liver disease treatment including diet and weight loss.  I am providing printed information for her.  She is obese, but does not have hypertension, hyperlipidemia, or diabetes.  I feel this is at a point in fatty liver disease that she can make a significant difference and prevent worsening outcomes.  Follow-up in 4 months.

## 2019-11-05 ENCOUNTER — Telehealth: Payer: Self-pay | Admitting: Gastroenterology

## 2019-11-05 NOTE — Telephone Encounter (Signed)
Patient called stating she needs to cancel her procedure

## 2019-11-05 NOTE — Telephone Encounter (Signed)
ATC pt x 3, line doesn't ring.

## 2019-11-05 NOTE — Telephone Encounter (Signed)
Spoke with patient. She needs to put off EGD for a while right now d/t not having enough vacation days. Procedure cancelled. Called endo and LMOVM to cancel. Reports the medication she was put on at last OV has been helping and no episodes since starting medication. FYI to EG.

## 2019-11-13 NOTE — Telephone Encounter (Signed)
Noted  

## 2019-11-27 ENCOUNTER — Other Ambulatory Visit (HOSPITAL_COMMUNITY): Payer: BC Managed Care – PPO

## 2019-11-30 ENCOUNTER — Ambulatory Visit (HOSPITAL_COMMUNITY): Admit: 2019-11-30 | Payer: BC Managed Care – PPO | Admitting: Internal Medicine

## 2019-11-30 ENCOUNTER — Encounter (HOSPITAL_COMMUNITY): Payer: Self-pay

## 2019-11-30 SURGERY — EGD (ESOPHAGOGASTRODUODENOSCOPY)
Anesthesia: Moderate Sedation

## 2020-01-10 ENCOUNTER — Encounter: Payer: Self-pay | Admitting: Internal Medicine

## 2020-01-24 DIAGNOSIS — L0291 Cutaneous abscess, unspecified: Secondary | ICD-10-CM | POA: Diagnosis not present

## 2020-01-24 DIAGNOSIS — Z6841 Body Mass Index (BMI) 40.0 and over, adult: Secondary | ICD-10-CM | POA: Diagnosis not present

## 2020-01-24 DIAGNOSIS — L0390 Cellulitis, unspecified: Secondary | ICD-10-CM | POA: Diagnosis not present

## 2020-01-24 DIAGNOSIS — B354 Tinea corporis: Secondary | ICD-10-CM | POA: Diagnosis not present

## 2020-02-22 DIAGNOSIS — J9811 Atelectasis: Secondary | ICD-10-CM | POA: Diagnosis not present

## 2020-02-22 DIAGNOSIS — K219 Gastro-esophageal reflux disease without esophagitis: Secondary | ICD-10-CM | POA: Diagnosis not present

## 2020-02-22 DIAGNOSIS — R079 Chest pain, unspecified: Secondary | ICD-10-CM | POA: Diagnosis not present

## 2020-02-22 DIAGNOSIS — F17210 Nicotine dependence, cigarettes, uncomplicated: Secondary | ICD-10-CM | POA: Diagnosis not present

## 2020-02-22 DIAGNOSIS — Z9049 Acquired absence of other specified parts of digestive tract: Secondary | ICD-10-CM | POA: Diagnosis not present

## 2020-02-22 DIAGNOSIS — R1013 Epigastric pain: Secondary | ICD-10-CM | POA: Diagnosis not present

## 2020-02-22 DIAGNOSIS — R9431 Abnormal electrocardiogram [ECG] [EKG]: Secondary | ICD-10-CM | POA: Diagnosis not present

## 2020-02-22 DIAGNOSIS — R0602 Shortness of breath: Secondary | ICD-10-CM | POA: Diagnosis not present

## 2020-02-22 DIAGNOSIS — R0789 Other chest pain: Secondary | ICD-10-CM | POA: Diagnosis not present

## 2020-02-26 DIAGNOSIS — M7522 Bicipital tendinitis, left shoulder: Secondary | ICD-10-CM | POA: Diagnosis not present

## 2020-02-27 ENCOUNTER — Ambulatory Visit: Payer: BC Managed Care – PPO | Admitting: Gastroenterology

## 2020-02-28 ENCOUNTER — Ambulatory Visit: Payer: BC Managed Care – PPO | Admitting: Nurse Practitioner

## 2020-05-13 DIAGNOSIS — R059 Cough, unspecified: Secondary | ICD-10-CM | POA: Diagnosis not present

## 2020-05-13 DIAGNOSIS — Z20828 Contact with and (suspected) exposure to other viral communicable diseases: Secondary | ICD-10-CM | POA: Diagnosis not present

## 2020-07-01 ENCOUNTER — Other Ambulatory Visit: Payer: Self-pay | Admitting: Nurse Practitioner

## 2020-07-01 DIAGNOSIS — K760 Fatty (change of) liver, not elsewhere classified: Secondary | ICD-10-CM

## 2020-07-01 DIAGNOSIS — R110 Nausea: Secondary | ICD-10-CM

## 2020-07-01 DIAGNOSIS — R1013 Epigastric pain: Secondary | ICD-10-CM

## 2024-01-16 ENCOUNTER — Emergency Department (HOSPITAL_COMMUNITY)
Admission: EM | Admit: 2024-01-16 | Discharge: 2024-01-16 | Disposition: A | Discharge status: Home / Self Care | Attending: Emergency Medicine | Admitting: Emergency Medicine

## 2024-01-16 ENCOUNTER — Encounter (HOSPITAL_COMMUNITY): Payer: Self-pay | Admitting: Emergency Medicine

## 2024-01-16 ENCOUNTER — Other Ambulatory Visit: Payer: Self-pay

## 2024-01-16 DIAGNOSIS — F332 Major depressive disorder, recurrent severe without psychotic features: Secondary | ICD-10-CM | POA: Diagnosis present

## 2024-01-16 DIAGNOSIS — F4321 Adjustment disorder with depressed mood: Secondary | ICD-10-CM

## 2024-01-16 DIAGNOSIS — Z79899 Other long term (current) drug therapy: Secondary | ICD-10-CM | POA: Insufficient documentation

## 2024-01-16 DIAGNOSIS — F4381 Prolonged grief disorder: Secondary | ICD-10-CM | POA: Insufficient documentation

## 2024-01-16 DIAGNOSIS — Z636 Dependent relative needing care at home: Secondary | ICD-10-CM | POA: Diagnosis not present

## 2024-01-16 DIAGNOSIS — F29 Unspecified psychosis not due to a substance or known physiological condition: Secondary | ICD-10-CM | POA: Insufficient documentation

## 2024-01-16 DIAGNOSIS — F32A Depression, unspecified: Secondary | ICD-10-CM | POA: Insufficient documentation

## 2024-01-16 DIAGNOSIS — R45851 Suicidal ideations: Secondary | ICD-10-CM | POA: Insufficient documentation

## 2024-01-16 HISTORY — DX: Depression, unspecified: F32.A

## 2024-01-16 LAB — COMPREHENSIVE METABOLIC PANEL WITH GFR
ALT: 51 U/L — ABNORMAL HIGH (ref 0–44)
AST: 51 U/L — ABNORMAL HIGH (ref 15–41)
Albumin: 4 g/dL (ref 3.5–5.0)
Alkaline Phosphatase: 67 U/L (ref 38–126)
Anion gap: 11 (ref 5–15)
BUN: 17 mg/dL (ref 6–20)
CO2: 22 mmol/L (ref 22–32)
Calcium: 9.6 mg/dL (ref 8.9–10.3)
Chloride: 104 mmol/L (ref 98–111)
Creatinine, Ser: 0.71 mg/dL (ref 0.44–1.00)
GFR, Estimated: >60 mL/min (ref >60)
Glucose, Bld: 120 mg/dL — ABNORMAL HIGH (ref 70–99)
Potassium: 3.8 mmol/L (ref 3.5–5.1)
Sodium: 137 mmol/L (ref 135–145)
Total Bilirubin: 1 mg/dL (ref 0.0–1.2)
Total Protein: 8.2 g/dL — ABNORMAL HIGH (ref 6.5–8.1)

## 2024-01-16 LAB — CBC WITH DIFFERENTIAL/PLATELET
Abs Immature Granulocytes: 0.03 10*3/uL (ref 0.00–0.07)
Basophils Absolute: 0.1 10*3/uL (ref 0.0–0.1)
Basophils Relative: 1 %
Eosinophils Absolute: 0.4 10*3/uL (ref 0.0–0.5)
Eosinophils Relative: 5 %
HCT: 45.6 % (ref 36.0–46.0)
Hemoglobin: 15.3 g/dL — ABNORMAL HIGH (ref 12.0–15.0)
Immature Granulocytes: 0 %
Lymphocytes Relative: 33 %
Lymphs Abs: 2.9 10*3/uL (ref 0.7–4.0)
MCH: 31.8 pg (ref 26.0–34.0)
MCHC: 33.6 g/dL (ref 30.0–36.0)
MCV: 94.8 fL (ref 80.0–100.0)
Monocytes Absolute: 0.7 10*3/uL (ref 0.1–1.0)
Monocytes Relative: 8 %
Neutro Abs: 4.8 10*3/uL (ref 1.7–7.7)
Neutrophils Relative %: 53 %
Platelets: 203 10*3/uL (ref 150–400)
RBC: 4.81 MIL/uL (ref 3.87–5.11)
RDW: 13.1 % (ref 11.5–15.5)
WBC: 8.9 10*3/uL (ref 4.0–10.5)
nRBC: 0 % (ref 0.0–0.2)

## 2024-01-16 LAB — URINALYSIS, ROUTINE W REFLEX MICROSCOPIC
Bilirubin Urine: NEGATIVE
Glucose, UA: NEGATIVE mg/dL
Hgb urine dipstick: NEGATIVE
Ketones, ur: NEGATIVE mg/dL
Leukocytes,Ua: NEGATIVE
Nitrite: NEGATIVE
Protein, ur: NEGATIVE mg/dL
Specific Gravity, Urine: 1.019 (ref 1.005–1.030)
pH: 5 (ref 5.0–8.0)

## 2024-01-16 LAB — RAPID URINE DRUG SCREEN, HOSP PERFORMED
Amphetamines: NOT DETECTED
Barbiturates: NOT DETECTED
Benzodiazepines: POSITIVE — AB
Cocaine: NOT DETECTED
Opiates: NOT DETECTED
Tetrahydrocannabinol: POSITIVE — AB

## 2024-01-16 LAB — ETHANOL: Alcohol, Ethyl (B): <15 mg/dL (ref <15)

## 2024-01-16 LAB — CBG MONITORING, ED: Glucose-Capillary: 138 mg/dL — ABNORMAL HIGH (ref 70–99)

## 2024-01-16 LAB — POC URINE PREG, ED: Preg Test, Ur: NEGATIVE

## 2024-01-16 MED ORDER — ONDANSETRON HCL 4 MG PO TABS: 4.0000 mg | ORAL_TABLET | Freq: Three times a day (TID) | ORAL | Status: DC | PRN

## 2024-01-16 MED ORDER — DULOXETINE HCL 30 MG PO CPEP: ORAL_CAPSULE | ORAL | 2 refills | Status: AC

## 2024-01-16 MED ORDER — ACETAMINOPHEN 325 MG PO TABS
650.0000 mg | ORAL_TABLET | ORAL | Status: DC | PRN
Start: 2024-01-16 — End: 2024-01-17
  Administered 2024-01-16: 650 mg via ORAL
  Filled 2024-01-16: qty 2

## 2024-01-16 NOTE — ED Notes (Signed)
TTS in progess 

## 2024-01-16 NOTE — BH Assessment (Signed)
 TTS Assessment has been deferred to [facility]. Secure chat initiated for scheduling and coordination.

## 2024-01-16 NOTE — ED Provider Notes (Signed)
 [place] EMERGENCY DEPARTMENT AT [facility] Provider Note   CSN: [id number] Arrival date & time: 01/16/24  1259     History  Chief Complaint  Patient presents with  . V70.1    Tammie [name] is a 53 y.o. female with PMH as listed below who presents from home with family with c/o SI, grief, depression, caregiver burden.  Patient states that her partner of 37 years died 2 mos ago very suddenly from a lung infection and she is struggling with significant grief, her Mother has dementia that is advancing, multiple family members have passed away, hx of depression reported but has never had SI until the past 2 mos. Reports feeling overwhelmed with being the primary caretaker of her Mother, who with her dementia can be verbally abusive. Patient reports that she does not think that she could harm herself in the end because she has a responsibility to her mother, but that that that on the day that her partner died she wanted to "take all of the pills she has."  Her best friend is at her bedside and reports that she is under a significant amount of stress.  Patient reports that she tried an in person grief support group but does not find it helpful and states that she thinks she feels worse afterward after hearing all of the stories of other peoples grief as well.  She reports survivor guilt, grief, difficulty sleeping.  She states she is waking up in the night and when she does not hear the beeping of his machines from the hospital she is afraid he is not breathing but then realizes he is gone.  She is not currently seeing a mental health professional. Patient has never attempted suicide in the past.  She has no firearms in the home.  She has never stayed in an inpatient psychiatric facility.  Approximately 1 month ago she was prescribed Xanax which has helped her sleep some but now it is not helping as much.  She has had no other recent changes to her medicines.  Endorses some THC use which she  said helps her symptoms somewhat.  Denies alcohol or other drugs.  Denies seeing or hearing things other people do not, denies homicidal ideation.  Patient's friend at bedside states that she has made several comments over the last 2 months about taking pills to end her life.  She reports physically feeling well with no pain or complaints.    Past Medical History:  Diagnosis Date  . Allergies   . Anxiety   . Depression        Home Medications Prior to Admission medications   Medication Sig Start Date End Date Taking? Authorizing Provider  ALPRAZolam (XANAX) 0.5 MG tablet Take 0.5 mg by mouth 2 (two) times daily. 12/14/23  Yes [provider]  buPROPion (WELLBUTRIN XL) 150 MG 24 hr tablet Take 150 mg by mouth daily.   Yes [provider]  DULoxetine (CYMBALTA) 20 MG capsule Take 20 mg by mouth daily.   Yes [provider]  levothyroxine (SYNTHROID) 50 MCG tablet Take 50 mcg by mouth every morning. 11/22/23  Yes [provider]  meloxicam (MOBIC) 15 MG tablet Take 15 mg by mouth at bedtime.  09/14/17  Yes [provider]  metFORMIN (GLUCOPHAGE) 500 MG tablet Take 500 mg by mouth daily with breakfast.   Yes [provider]  montelukast  (SINGULAIR ) 10 MG tablet Take 10 mg by mouth daily. 10/08/19  Yes [provider]  Multiple Vitamin (MULTIVITAMIN WITH MINERALS) TABS tablet Take 1 tablet by mouth at bedtime.   Yes [provider]  omeprazole (PRILOSEC) 20 MG capsule Take 40 mg by mouth daily.   Yes [provider]  vitamin C (ASCORBIC ACID) 500 MG tablet Take 500 mg by mouth at bedtime.   Yes [provider]      Allergies    Aspirin and Codeine    Review of Systems   Review of Systems A 10 point review of systems was performed and is negative unless otherwise reported in HPI.  Physical Exam Updated Vital Signs BP 132/86 (BP Location: Right Arm)   Pulse 68   Temp 98.6 F (37 C) (Oral)   Resp 16    Ht 5\' 4"  (1.626 m)   Wt 105.7 kg   SpO2 98%   BMI 39.99 kg/m  Physical Exam General: Normal appearing female, lying in bed.  HEENT: PERRLA, Sclera anicteric, MMM, trachea midline.  Cardiology: RRR, no murmurs/rubs/gallops.  Resp: Normal respiratory rate and effort. CTAB, no wheezes, rhonchi, crackles.  Abd: Soft, non-tender, non-distended. No rebound tenderness or guarding.  GU: Deferred. MSK: No peripheral edema or signs of trauma. Extremities without deformity or TTP. No cyanosis or clubbing. Skin: warm, dry. No rashes or lesions. Back: No CVA tenderness Neuro: A&Ox4, CNs II-XII grossly intact. MAEs. Sensation grossly intact.  Psych: Calm, good eye contact, good insight, tearful.  ED Results / Procedures / Treatments   Labs (all labs ordered are listed, but only abnormal results are displayed) Labs Reviewed  CBC WITH DIFFERENTIAL/PLATELET - Abnormal; Notable for the following components:      Result Value   Hemoglobin 15.3 (*)    All other components within normal limits  COMPREHENSIVE METABOLIC PANEL WITH GFR - Abnormal; Notable for the following components:   Glucose, Bld 120 (*)    Total Protein 8.2 (*)    AST 51 (*)    ALT 51 (*)    All other components within normal limits  URINALYSIS, ROUTINE W REFLEX MICROSCOPIC - Abnormal; Notable for the following components:   APPearance HAZY (*)    All other components within normal limits  RAPID URINE DRUG SCREEN, HOSP PERFORMED - Abnormal; Notable for the following components:   Benzodiazepines POSITIVE (*)    Tetrahydrocannabinol POSITIVE (*)    All other components within normal limits  CBG MONITORING, ED - Abnormal; Notable for the following components:   Glucose-Capillary 138 (*)    All other components within normal limits  ETHANOL  POC URINE PREG, ED    EKG EKG Interpretation Date/Time:  Monday January 16 2024 13:49:35 EDT Ventricular Rate:  80 PR Interval:  152 QRS Duration:  76 QT Interval:  362 QTC  Calculation: 417 R Axis:   -26  Text Interpretation: Normal sinus rhythm Low voltage QRS Confirmed by Annita Kindle [phone]) on 01/16/2024 3:19:26 PM  Radiology No results found.  Procedures Procedures    Medications Ordered in ED Medications  acetaminophen (TYLENOL) tablet 650 mg (has no administration in time range)  ondansetron  (ZOFRAN ) tablet 4 mg (has no administration in time range)    ED Course/ Medical Decision Making/ A&P                          Medical Decision Making Amount and/or Complexity of Data Reviewed Labs: ordered. Decision-making details documented in ED Course.  Risk OTC drugs. Prescription drug management.    This  patient presents to the ED for concern of suicidal ideation, this involves an extensive number of treatment options, and is a complaint that carries with it a high risk of complications and morbidity.  I considered the following differential and admission for this acute, potentially life threatening condition.   MDM:    Patient with good insight.  Patient with significant grief as well as caregiver burden.  She has symptoms including sleep disturbance and states her Xanax has improved her symptoms some but not helping anymore.  Patient has several protective factors including support from her lifelong best friend at bedside as well as the responsibility of caring for her mother and states that she "would not do it in the end," but has had several episodes of SI with a plan.  Patient agrees to stay voluntarily to see a psychiatrist and is hopeful to get outpatient follow-up as well and/or adjustments made to her medications today.  No signs of mania, HI, psychosis.  No report of significant substance abuse or daily alcohol use.  Will consult with psychiatry.   Clinical Course as of 01/16/24 2101  Mon Jan 16, 2024  1518 Comprehensive metabolic panel(!) Very mild elevation in LFTs improved from 5 years ago.  Otherwise unremarkable in the context of  presentation both CMP and CBC.  Alcohol negative. [HN]  1701 Neg preg, neg UA, UDS +benzo, THC.  [HN]  1701 [facility] [HN]  2101 Psychiatry recommended giving patient outpatient resources for counseling. She also recommended increasing the Duloxetine. Recommended "Prescribed 30 mg caps. Take 30 mg for 4 days then increase to 60 mg daily " [HN]    Clinical Course User Index [HN] Merdis Stalling, MD    Labs: I Ordered, and personally interpreted labs.  The pertinent results include: Those listed above  Additional history obtained from chart review, friend at bedside.   Reevaluation: After the interventions noted above, I reevaluated the patient and found that they have :stayed the same  Social Determinants of Health: . lives independently  Disposition:  [facility]  Co morbidities that complicate the patient evaluation . Past Medical History:  Diagnosis Date  . Allergies   . Anxiety   . Depression      Medicines Meds ordered this encounter  Medications  . acetaminophen (TYLENOL) tablet 650 mg  . ondansetron  (ZOFRAN ) tablet 4 mg    I have reviewed the patients home medicines and have made adjustments as needed  Problem List / ED Course: Problem List Items Addressed This Visit   None Visit Diagnoses       Grief    -  Primary     Suicidal ideation         Caregiver burden                       This note was created using dictation software, which may contain spelling or grammatical errors.    Merdis Stalling, MD 01/16/24 [phone]

## 2024-01-16 NOTE — Progress Notes (Signed)
 [facility] Consult Note  Patient Name: Tammie [name] MRN: [id number] DOB: Aug 21, 1970 DATE OF Consult: 01/16/2024  PRIMARY PSYCHIATRIC DIAGNOSES  1.  Major depressive disorder 2.  PTSD   RECOMMENDATIONS  Recommendations: Medication recommendations: Please prescribed duloxetine 30 mg caps. Take 30 mg for 4 days then increase to 60 mg daily.  Continue Bupropion 150 mg daily.  Non-Medication/therapeutic recommendations: Please refer patient to outpatient therapy and psychiatry  Is inpatient psychiatric hospitalization recommended for this patient? No (Explain why): No indication  Follow-Up Telepsychiatry C/L services: We will sign off for now. Please re-consult our service if needed for any concerning changes in the patient's condition, discharge planning, or questions.  Thank you for involving us  in the care of this patient. If you have any additional questions or concerns, please call [phone] and ask for me or the provider on-call.  TELEPSYCHIATRY ATTESTATION & CONSENT  As the provider for this telehealth consult, I attest that I verified the patient's identity using two separate identifiers, introduced myself to the patient, provided my credentials, disclosed my location, and performed this encounter via a HIPAA-compliant, real-time, face-to-face, two-way, interactive audio and video platform and with the full consent and agreement of the patient (or guardian as applicable.)  Patient physical location: [facility] Emergency Department at [facility] . Telehealth provider physical location: home office in state of [place] .  Video start time: 7 pm CST (Central Time) Video end time: 7:30 pm  (Central Time)  IDENTIFYING DATA  Tammie [name] is a 53 y.o. year-old female for whom a psychiatric consultation has been ordered by the primary provider. The patient was identified using two separate identifiers.  CHIEF COMPLAINT/REASON FOR CONSULT  Depression   HISTORY OF PRESENT  ILLNESS (HPI)  The patient   Is a 53 year old female who has been struggling with depression, anxiety, and some PTSD since the death of her partner of 37 years.  He died suddenly of a lung infection.  They had been together since she was 16 and he was 15.  He passed 2 months ago.  She reports having stayed in the hospital with him and getting used to the sounds of the machines.  She now wakes up every night not hearing the machines, wondering if he is not breathing, and then realizing he has passed.  She has not slept well since. Her primary care physician her has prescribed her bupropion 150 mg and duloxetine 20 mg.  She has been [facility] which she is not finding helpful.  People just talk about their experiences and she believes more burdened than when she arrived. She agrees that individual therapy would potentially be helpful and is also open to seeing a psychiatrist. She is now living with her mom who has been mentioned caring for her and that has been hard on her as well. She says while she has thoughts of suicide and says she has said allowed things that she should not have, she would never do anything.  Her mom has lost 2 children and she would never do this to her or anyone else. She does not feel that she warrants inpatient admission at this time, feels safe discharging home with outpatient resources.Aaron Aas  PAST PSYCHIATRIC HISTORY   Otherwise as per HPI above.  PAST MEDICAL HISTORY  Past Medical History:  Diagnosis Date   Allergies    Anxiety    Depression      HOME MEDICATIONS  Facility Ordered Medications  Medication   acetaminophen (TYLENOL) tablet 650 mg   ondansetron  (ZOFRAN ) tablet 4 mg   PTA Medications  Medication Sig   meloxicam (MOBIC) 15 MG tablet Take 15 mg by mouth at bedtime.    vitamin C (ASCORBIC ACID) 500 MG tablet Take 500 mg by mouth at bedtime.   Multiple Vitamin (MULTIVITAMIN WITH MINERALS) TABS tablet Take 1 tablet by mouth at  bedtime.   montelukast  (SINGULAIR ) 10 MG tablet Take 10 mg by mouth daily.   ALPRAZolam (XANAX) 0.5 MG tablet Take 0.5 mg by mouth 2 (two) times daily.   levothyroxine (SYNTHROID) 50 MCG tablet Take 50 mcg by mouth every morning.   buPROPion (WELLBUTRIN XL) 150 MG 24 hr tablet Take 150 mg by mouth daily.   metFORMIN (GLUCOPHAGE) 500 MG tablet Take 500 mg by mouth daily with breakfast.   DULoxetine (CYMBALTA) 20 MG capsule Take 20 mg by mouth daily.     ALLERGIES  Allergies  Allergen Reactions   Aspirin Hives   Codeine Nausea And Vomiting    SOCIAL & SUBSTANCE USE HISTORY  Social History   Socioeconomic History   Marital status: Single    Spouse name: Not on file   Number of children: Not on file   Years of education: Not on file   Highest education level: Not on file  Occupational History   Not on file  Tobacco Use   Smoking status: Every Day    Current packs/day: 0.50    Types: Cigarettes   Smokeless tobacco: Never  Vaping Use   Vaping status: Never Used  Substance and Sexual Activity   Alcohol use: No   Drug use: No   Sexual activity: Not on file  Other Topics Concern   Not on file  Social History Narrative   Not on file   Social Drivers of Health   Financial Resource Strain: Not on file  Food Insecurity: Not on file  Transportation Needs: No Transportation Needs (11/11/2022)   [facility]   PRAPARE - Transportation    Lack of Transportation (Medical): No    Lack of Transportation (Non-Medical): No  Physical Activity: Not on file  Stress: Not on file  Social Connections: Not on file   Social History   Tobacco Use  Smoking Status Every Day   Current packs/day: 0.50   Types: Cigarettes  Smokeless Tobacco Never   Social History   Substance and Sexual Activity  Alcohol Use No   Social History   Substance and Sexual Activity  Drug Use No      FAMILY HISTORY  Family History  Problem Relation Age of Onset   Esophageal cancer  Paternal Grandfather    Colon cancer Neg Hx    Family Psychiatric History (if known):  mother has dementia   MENTAL STATUS EXAM (MSE)  Mental Status Exam: General Appearance: Casual  Orientation:  Full (Time, Place, and Person)  Memory:  Immediate;   Good  Concentration:  Concentration: Good  Recall:  Good  Attention  Fair  Eye Contact:  Good  Speech:  Normal Rate  Language:  Good  Volume:  Normal  Mood: depressed/ anxious   Affect:  Appropriate  Thought Process:  Coherent  Thought Content:  Logical  Suicidal Thoughts:  No  Homicidal Thoughts:  No  Judgement:  Good  Insight:  Good  Psychomotor Activity:  Normal  Akathisia:  No  Fund of Knowledge:  Good    Assets:  Communication Skills Desire for Improvement  Housing Social Support  Cognition:  WNL  ADL's:  Intact  AIMS (if indicated):       VITALS  Blood pressure 132/86, pulse 68, temperature 98.6 F (37 C), temperature source Oral, resp. rate 16, height 5\' 4"  (1.626 m), weight 105.7 kg, SpO2 98%.  LABS  Admission on 01/16/2024  Component Date Value Ref Range Status   Preg Test, Ur 01/16/2024 Negative  Negative Final   WBC 01/16/2024 8.9  4.0 - 10.5 K/uL Final   RBC 01/16/2024 4.81  3.87 - 5.11 MIL/uL Final   Hemoglobin 01/16/2024 15.3 (H)  12.0 - 15.0 g/dL Final   HCT 86/57/8469 45.6  36.0 - 46.0 % Final   MCV 01/16/2024 94.8  80.0 - 100.0 fL Final   MCH 01/16/2024 31.8  26.0 - 34.0 pg Final   MCHC 01/16/2024 33.6  30.0 - 36.0 g/dL Final   RDW 62/95/2841 13.1  11.5 - 15.5 % Final   Platelets 01/16/2024 203  150 - 400 K/uL Final   nRBC 01/16/2024 0.0  0.0 - 0.2 % Final   Neutrophils Relative % 01/16/2024 53  % Final   Neutro Abs 01/16/2024 4.8  1.7 - 7.7 K/uL Final   Lymphocytes Relative 01/16/2024 33  % Final   Lymphs Abs 01/16/2024 2.9  0.7 - 4.0 K/uL Final   Monocytes Relative 01/16/2024 8  % Final   Monocytes Absolute 01/16/2024 0.7  0.1 - 1.0 K/uL Final   Eosinophils Relative 01/16/2024 5  % Final    Eosinophils Absolute 01/16/2024 0.4  0.0 - 0.5 K/uL Final   Basophils Relative 01/16/2024 1  % Final   Basophils Absolute 01/16/2024 0.1  0.0 - 0.1 K/uL Final   Immature Granulocytes 01/16/2024 0  % Final   Abs Immature Granulocytes 01/16/2024 0.03  0.00 - 0.07 K/uL Final   Performed at [facility], [address] [place], [place] [address]   Sodium 01/16/2024 137  135 - 145 mmol/L Final   Potassium 01/16/2024 3.8  3.5 - 5.1 mmol/L Final   Chloride 01/16/2024 104  98 - 111 mmol/L Final   CO2 01/16/2024 22  22 - 32 mmol/L Final   Glucose, Bld 01/16/2024 120 (H)  70 - 99 mg/dL Final   Glucose reference range applies only to samples taken after fasting for at least 8 hours.   BUN 01/16/2024 17  6 - 20 mg/dL Final   Creatinine, Ser 01/16/2024 0.71  0.44 - 1.00 mg/dL Final   Calcium 06/05/2535 9.6  8.9 - 10.3 mg/dL Final   Total Protein 64/40/3474 8.2 (H)  6.5 - 8.1 g/dL Final   Albumin 25/95/6387 4.0  3.5 - 5.0 g/dL Final   AST 56/43/3295 51 (H)  15 - 41 U/L Final   ALT 01/16/2024 51 (H)  0 - 44 U/L Final   Alkaline Phosphatase 01/16/2024 67  38 - 126 U/L Final   Total Bilirubin 01/16/2024 1.0  0.0 - 1.2 mg/dL Final   GFR, Estimated 01/16/2024 >60  >60 mL/min Final   Comment: (NOTE) Calculated using the CKD-EPI Creatinine Equation (2021)    Anion gap 01/16/2024 11  5 - 15 Final   Performed at [facility], [address] [place], [place] [address]   Color, Urine 01/16/2024 YELLOW  YELLOW Final   APPearance 01/16/2024 HAZY (A)  CLEAR Final   Specific Gravity, Urine 01/16/2024 1.019  1.005 - 1.030 Final   pH 01/16/2024 5.0  5.0 - 8.0 Final   Glucose, UA 01/16/2024 NEGATIVE  NEGATIVE mg/dL  Final   Hgb urine dipstick 01/16/2024 NEGATIVE  NEGATIVE Final   Bilirubin Urine 01/16/2024 NEGATIVE  NEGATIVE Final   Ketones, ur 01/16/2024 NEGATIVE  NEGATIVE mg/dL Final   Protein, ur 40/98/1191 NEGATIVE  NEGATIVE mg/dL Final   Nitrite 47/82/9562 NEGATIVE  NEGATIVE Final   Leukocytes,Ua 01/16/2024  NEGATIVE  NEGATIVE Final   Performed at [facility], [address] [place], [place] [address]   Opiates 01/16/2024 NONE DETECTED  NONE DETECTED Final   Cocaine 01/16/2024 NONE DETECTED  NONE DETECTED Final   Benzodiazepines 01/16/2024 POSITIVE (A)  NONE DETECTED Final   Amphetamines 01/16/2024 NONE DETECTED  NONE DETECTED Final   Tetrahydrocannabinol 01/16/2024 POSITIVE (A)  NONE DETECTED Final   Barbiturates 01/16/2024 NONE DETECTED  NONE DETECTED Final   Comment: (NOTE) DRUG SCREEN FOR MEDICAL PURPOSES ONLY.  IF CONFIRMATION IS NEEDED FOR ANY PURPOSE, NOTIFY LAB WITHIN 5 DAYS.  LOWEST DETECTABLE LIMITS FOR URINE DRUG SCREEN Drug Class                     Cutoff (ng/mL) Amphetamine and metabolites    1000 Barbiturate and metabolites    200 Benzodiazepine                 200 Opiates and metabolites        300 Cocaine and metabolites        300 THC                            50 Performed at [facility], [address] [place], [place] [address]    Alcohol, Ethyl (B) 01/16/2024 <15  <15 mg/dL Final   Comment: (NOTE) For medical purposes only. Performed at [facility], [address] [place], [place] [address]    Glucose-Capillary 01/16/2024 138 (H)  70 - 99 mg/dL Final   Glucose reference range applies only to samples taken after fasting for at least 8 hours.    PSYCHIATRIC REVIEW OF SYSTEMS (ROS)  ROS: Notable for the following relevant positive findings: ROS  Additional findings:      Musculoskeletal: No abnormal movements observed      Gait & Station: Normal      Pain Screening: Denies    RISK FORMULATION/ASSESSMENT  Is the patient experiencing any suicidal or homicidal ideations: No       Explain if yes: " I said things I didn't mean". Would never do anything to hurt self  Protective factors considered for safety management: Mother's caretaker, social support, help seeking   Risk factors/concerns considered for safety management:  Recent loss  Is there a safety  management plan with the patient and treatment team to minimize risk factors and promote protective factors: Yes           Explain: Medication adjustment and referral to outpatient services.  Is crisis care placement or psychiatric hospitalization recommended: No     Based on my current evaluation and risk assessment, patient is determined at this time to be at:  Low risk  *RISK ASSESSMENT Risk assessment is a dynamic process; it is possible that this patient's condition, and risk level, may change. This should be re-evaluated and managed over time as appropriate. Please re-consult psychiatric consult services if additional assistance is needed in terms of risk assessment and management. If your team decides to discharge this patient, please advise the patient how to best access emergency psychiatric services, or to call 911, if their condition  worsens or they feel unsafe in any way.   Davey Erp, MD Telepsychiatry Consult ServicesPatient ID: Tammie [name], female   DOB: 1970-12-25, 53 y.o.   MRN: [id number]

## 2024-01-16 NOTE — Discharge Instructions (Addendum)
 Thank you for coming to [facility] Emergency Department. We are so sorry for your loss of your partner. Please follow up outpatient with a psychologist, counselor, or psychiatrist. Please utilize the resource guide attached or go to  [email or web address] to help find a therapist who will take your insurance. Please increase your duloxetine: please take 30 mg for four days and then increase to 60 mg daily.   Please follow up with your primary care provider within 1 week.   Do not hesitate to return to the ED or call 911 if you experience: -Worsening symptoms -Further thoughts of wanting to harm yourself -Lightheadedness, passing out -Fevers/chills -Anything else that concerns you

## 2024-01-16 NOTE — Progress Notes (Signed)
 Patient undressed and now in burgundy scrubs, security wanded patient and belongings placed in locker 8

## 2024-01-16 NOTE — ED Notes (Signed)
Pt belongings returned to pt 

## 2024-01-16 NOTE — ED Triage Notes (Addendum)
 Pt from home with family with c/o SI, pt and family states pt fiance died 2 mos ago, her Mother has dementia that is advancing, multiple family members have passed away, hx of depression reported but has never had SI until the past 2 mos, pt calm and cooperative but tearful in triage, reports feeling overwhelmed with being the primary caretaker of her Mother, pt further reports weight loss of about 30 lbs, pt has seen her PCP and was started on multiple medications to help with depression but feels they have not been helpful
